# Patient Record
Sex: Female | Born: 1935 | Race: White | Hispanic: No | Smoking: Current every day smoker
Health system: Southern US, Community
[De-identification: ages and names within clinical notes are randomized; demographics above are authoritative.]

## PROBLEM LIST (undated history)

## (undated) DIAGNOSIS — I10 Essential (primary) hypertension: Secondary | ICD-10-CM

## (undated) DIAGNOSIS — I739 Peripheral vascular disease, unspecified: Secondary | ICD-10-CM

## (undated) DIAGNOSIS — I779 Disorder of arteries and arterioles, unspecified: Secondary | ICD-10-CM

## (undated) DIAGNOSIS — I2510 Atherosclerotic heart disease of native coronary artery without angina pectoris: Secondary | ICD-10-CM

## (undated) DIAGNOSIS — R0602 Shortness of breath: Secondary | ICD-10-CM

## (undated) DIAGNOSIS — I679 Cerebrovascular disease, unspecified: Secondary | ICD-10-CM

## (undated) DIAGNOSIS — E785 Hyperlipidemia, unspecified: Secondary | ICD-10-CM

## (undated) HISTORY — PX: ENDARTERECTOMY: SHX5162

## (undated) HISTORY — DX: Atherosclerotic heart disease of native coronary artery without angina pectoris: I25.10

## (undated) HISTORY — DX: Peripheral vascular disease, unspecified: I73.9

## (undated) HISTORY — DX: Cerebrovascular disease, unspecified: I67.9

## (undated) HISTORY — PX: OTHER SURGICAL HISTORY: SHX169

## (undated) HISTORY — DX: Hyperlipidemia, unspecified: E78.5

## (undated) HISTORY — DX: Essential (primary) hypertension: I10

## (undated) HISTORY — PX: PSEUDOANEURYSM REPAIR: SHX2272

## (undated) HISTORY — DX: Disorder of arteries and arterioles, unspecified: I77.9

---

## 2000-03-10 ENCOUNTER — Emergency Department (HOSPITAL_COMMUNITY): Admission: EM | Admit: 2000-03-10 | Discharge: 2000-03-11 | Payer: Self-pay | Admitting: Emergency Medicine

## 2002-03-31 ENCOUNTER — Encounter: Admission: RE | Admit: 2002-03-31 | Discharge: 2002-03-31 | Payer: Self-pay | Admitting: Internal Medicine

## 2002-03-31 ENCOUNTER — Encounter: Payer: Self-pay | Admitting: Internal Medicine

## 2002-09-01 ENCOUNTER — Encounter: Payer: Self-pay | Admitting: Vascular Surgery

## 2002-09-02 ENCOUNTER — Ambulatory Visit (HOSPITAL_COMMUNITY): Admission: RE | Admit: 2002-09-02 | Discharge: 2002-09-02 | Payer: Self-pay | Admitting: Vascular Surgery

## 2002-10-06 ENCOUNTER — Inpatient Hospital Stay (HOSPITAL_COMMUNITY): Admission: RE | Admit: 2002-10-06 | Discharge: 2002-10-08 | Payer: Self-pay | Admitting: Vascular Surgery

## 2002-10-06 ENCOUNTER — Encounter: Payer: Self-pay | Admitting: Vascular Surgery

## 2002-10-06 ENCOUNTER — Encounter (INDEPENDENT_AMBULATORY_CARE_PROVIDER_SITE_OTHER): Payer: Self-pay | Admitting: Specialist

## 2003-07-19 ENCOUNTER — Emergency Department (HOSPITAL_COMMUNITY): Admission: EM | Admit: 2003-07-19 | Discharge: 2003-07-19 | Payer: Self-pay | Admitting: Emergency Medicine

## 2003-07-27 ENCOUNTER — Ambulatory Visit (HOSPITAL_COMMUNITY): Admission: RE | Admit: 2003-07-27 | Discharge: 2003-07-27 | Payer: Self-pay | Admitting: Pediatrics

## 2006-08-05 ENCOUNTER — Ambulatory Visit: Payer: Self-pay | Admitting: Vascular Surgery

## 2006-08-21 ENCOUNTER — Ambulatory Visit: Payer: Self-pay | Admitting: Cardiology

## 2006-08-21 ENCOUNTER — Encounter: Payer: Self-pay | Admitting: Cardiology

## 2006-08-21 ENCOUNTER — Ambulatory Visit: Payer: Self-pay | Admitting: Vascular Surgery

## 2006-08-22 ENCOUNTER — Inpatient Hospital Stay (HOSPITAL_COMMUNITY): Admission: AD | Admit: 2006-08-22 | Discharge: 2006-08-25 | Payer: Self-pay | Admitting: Vascular Surgery

## 2006-09-02 ENCOUNTER — Ambulatory Visit: Payer: Self-pay | Admitting: Vascular Surgery

## 2006-09-09 ENCOUNTER — Ambulatory Visit: Payer: Self-pay | Admitting: Vascular Surgery

## 2006-09-15 ENCOUNTER — Ambulatory Visit: Payer: Self-pay | Admitting: Cardiology

## 2006-09-26 ENCOUNTER — Ambulatory Visit: Payer: Self-pay

## 2006-10-07 ENCOUNTER — Ambulatory Visit: Payer: Self-pay | Admitting: Vascular Surgery

## 2007-02-25 ENCOUNTER — Ambulatory Visit: Payer: Self-pay | Admitting: Cardiology

## 2007-03-03 ENCOUNTER — Ambulatory Visit: Payer: Self-pay

## 2007-09-16 ENCOUNTER — Ambulatory Visit: Payer: Self-pay | Admitting: Cardiology

## 2007-09-29 ENCOUNTER — Ambulatory Visit: Payer: Self-pay | Admitting: Cardiology

## 2007-10-12 ENCOUNTER — Ambulatory Visit: Payer: Self-pay | Admitting: Vascular Surgery

## 2008-03-11 ENCOUNTER — Ambulatory Visit: Payer: Self-pay

## 2008-03-23 ENCOUNTER — Ambulatory Visit: Payer: Self-pay | Admitting: Vascular Surgery

## 2008-03-30 ENCOUNTER — Ambulatory Visit: Payer: Self-pay | Admitting: Cardiology

## 2008-07-15 ENCOUNTER — Encounter (INDEPENDENT_AMBULATORY_CARE_PROVIDER_SITE_OTHER): Payer: Self-pay | Admitting: *Deleted

## 2008-08-19 ENCOUNTER — Encounter (INDEPENDENT_AMBULATORY_CARE_PROVIDER_SITE_OTHER): Payer: Self-pay | Admitting: *Deleted

## 2009-03-22 ENCOUNTER — Ambulatory Visit: Payer: Self-pay

## 2009-03-22 ENCOUNTER — Encounter: Payer: Self-pay | Admitting: Cardiology

## 2009-03-22 DIAGNOSIS — I6529 Occlusion and stenosis of unspecified carotid artery: Secondary | ICD-10-CM

## 2009-04-04 ENCOUNTER — Ambulatory Visit: Payer: Self-pay | Admitting: Vascular Surgery

## 2009-04-06 DIAGNOSIS — I739 Peripheral vascular disease, unspecified: Secondary | ICD-10-CM

## 2009-04-06 DIAGNOSIS — I679 Cerebrovascular disease, unspecified: Secondary | ICD-10-CM | POA: Insufficient documentation

## 2009-04-06 DIAGNOSIS — E785 Hyperlipidemia, unspecified: Secondary | ICD-10-CM

## 2009-04-06 DIAGNOSIS — I2510 Atherosclerotic heart disease of native coronary artery without angina pectoris: Secondary | ICD-10-CM | POA: Insufficient documentation

## 2009-04-06 DIAGNOSIS — I10 Essential (primary) hypertension: Secondary | ICD-10-CM | POA: Insufficient documentation

## 2009-04-13 ENCOUNTER — Ambulatory Visit: Payer: Self-pay | Admitting: Cardiology

## 2009-04-13 DIAGNOSIS — F17200 Nicotine dependence, unspecified, uncomplicated: Secondary | ICD-10-CM

## 2010-02-20 NOTE — Assessment & Plan Note (Signed)
Summary: f1y/ gd      Allergies Added: NKDA  Visit Type:  1 yr f/u Primary Provider:  Dr. Felipa Eth  CC:  edema/ankles...denies any cp or sob.  History of Present Illness: Ms Bluett returns today for evaluation and management of her coronary artery disease and peripheral vascular disease.  She's having no angina or ischemic symptoms. She does have some dyspnea on exertion. She continues to smoke about a half pack cigarettes a day.  She denies any symptoms of TIAs or mini strokes which were reviewed today. Her carotid Dopplers March 22, 2009 showed antegrade flow in both vertebrals and a nonobstructive plaque in the right internal carotid artery and 40-59% in the left internal carotid artery.  Her blood work is followed by primary care.  Her peripheral vascular disease of her lower extremities followed by Dr. Hart Rochester. She had checkup last week and said things are stable.  Current Medications (verified): 1)  Simvastatin 20 Mg Tabs (Simvastatin) .Marland Kitchen.. 1 Tab Once Daily 2)  Amlodipine Besylate 5 Mg Tabs (Amlodipine Besylate) .Marland Kitchen.. 1 Tab Once Daily 3)  Multivitamins   Tabs (Multiple Vitamin) .Marland Kitchen.. 1 Tab Once Daily 4)  Aspirin Ec 325 Mg Tbec (Aspirin) .... Take One Tablet By Mouth Daily 5)  Zolpidem Tartrate 10 Mg Tabs (Zolpidem Tartrate) .Marland Kitchen.. 1 Tab At Bedtime 6)  Metoprolol Succinate 25 Mg Xr24h-Tab (Metoprolol Succinate) .Marland Kitchen.. 1 Tab Once Daily  Allergies (verified): No Known Drug Allergies  Past History:  Past Medical History: Last updated: 04/06/2009 CAD, NATIVE VESSEL (ICD-414.01) HYPERLIPIDEMIA (ICD-272.4) HYPERTENSION (ICD-401.9) PERIPHERAL VASCULAR DISEASE (ICD-443.9) CEREBROVASCULAR DISEASE (ICD-437.9) CAROTID ARTERY DISEASE (ICD-433.10)    Past Surgical History: Last updated: 04/06/2009 Repair of right femoral pseudoaneurysm.  1. Attempted cannulation left common femoral artery with inability to       pass guidewire proximally through iliac system.   2. Abdominal aortogram  with bilateral lower extremity runoff via right       common femoral approach.   Left common femoral and proximal superficial femoral  endarterectomy with Dacron patch angioplasty.    Family History: Last updated: 04/06/2009  Negative for CAD in both mother and father.  She has a   brother with cancer.   Social History: Last updated: 04/06/2009  She lives in [place] alone.  She is retired.  She is   a widow.  Her daughter-in-law is a good historian and does keep her  records with her.  She is a heavy smoker, one and a half pack a day for  approximately 60+ years.  Negative for ETOH or drug use  Review of Systems       negative other than history of present illness  Vital Signs:  Patient profile:   77 year old female Height:      67 inches Weight:      158 pounds BMI:     24.84 Pulse rate:   75 / minute Pulse rhythm:   irregular BP sitting:   110 / 60  (left arm) Cuff size:   large  Vitals Entered By: Danielle Rankin, CMA (April 13, 2009 2:15 PM)  Physical Exam  General:  Well developed, well nourished, in no acute distress. Head:  normocephalic and atraumatic Eyes:  arcus senilis Neck:  Neck supple, no JVD. No masses, thyromegaly or abnormal cervical nodes. Lungs:  decreased breath sounds throughout Heart:  soft S1-S2 regular rate and rhythm Msk:  decreased ROM.  decreased ROM.   Pulses:  2+ over 4+ right dorsalis pedis 1+ over  4+ left Extremities:  No clubbing or cyanosis. Neurologic:  Alert and oriented x 3. Skin:  Intact without lesions or rashes.   Impression & Recommendations:  Problem # 1:  CAD, NATIVE VESSEL (ICD-414.01)  Her updated medication list for this problem includes:    Amlodipine Besylate 5 Mg Tabs (Amlodipine besylate) .Marland Kitchen... 1 tab once daily    Aspirin Ec 325 Mg Tbec (Aspirin) .Marland Kitchen... Take one tablet by mouth daily    Metoprolol Succinate 25 Mg Xr24h-tab (Metoprolol succinate) .Marland Kitchen... 1 tab once daily  Orders: EKG w/ Interpretation  (93000)  Problem # 2:  CAROTID ARTERY DISEASE (ICD-433.10) Assessment: Unchanged will check carotids and a year Her updated medication list for this problem includes:    Aspirin Ec 325 Mg Tbec (Aspirin) .Marland Kitchen... Take one tablet by mouth daily  Problem # 3:  HYPERTENSION (ICD-401.9) Assessment: Improved  Her updated medication list for this problem includes:    Amlodipine Besylate 5 Mg Tabs (Amlodipine besylate) .Marland Kitchen... 1 tab once daily    Aspirin Ec 325 Mg Tbec (Aspirin) .Marland Kitchen... Take one tablet by mouth daily    Metoprolol Succinate 25 Mg Xr24h-tab (Metoprolol succinate) .Marland Kitchen... 1 tab once daily  Problem # 4:  HYPERLIPIDEMIA (ICD-272.4)  Her updated medication list for this problem includes:    Simvastatin 20 Mg Tabs (Simvastatin) .Marland Kitchen... 1 tab once daily  Problem # 5:  TOBACCO USER (ICD-305.1) Assessment: Unchanged advised once again to quit  Patient Instructions: 1)  Your physician recommends that you schedule a follow-up appointment in: YEAR WITH DR Min Collymore 2)  Your physician recommends that you continue on your current medications as directed. Please refer to the Current Medication list given to you today.

## 2010-02-20 NOTE — Miscellaneous (Signed)
Summary: Orders Update  Clinical Lists Changes  Problems: Added new problem of CAROTID ARTERY DISEASE (ICD-433.10) Orders: Added new Test order of Carotid Duplex (Carotid Duplex) - Signed 

## 2010-04-12 ENCOUNTER — Other Ambulatory Visit: Payer: Self-pay | Admitting: *Deleted

## 2010-04-12 DIAGNOSIS — I2510 Atherosclerotic heart disease of native coronary artery without angina pectoris: Secondary | ICD-10-CM

## 2010-04-12 MED ORDER — METOPROLOL SUCCINATE ER 25 MG PO TB24: 25.0000 mg | ORAL_TABLET | Freq: Every day | ORAL | Status: DC

## 2010-05-10 ENCOUNTER — Encounter: Payer: Self-pay | Admitting: Cardiology

## 2010-05-11 ENCOUNTER — Encounter: Payer: Self-pay | Admitting: Cardiology

## 2010-05-11 ENCOUNTER — Ambulatory Visit (INDEPENDENT_AMBULATORY_CARE_PROVIDER_SITE_OTHER): Payer: Medicare Other | Admitting: Cardiology

## 2010-05-11 DIAGNOSIS — I6529 Occlusion and stenosis of unspecified carotid artery: Secondary | ICD-10-CM

## 2010-05-11 DIAGNOSIS — I2510 Atherosclerotic heart disease of native coronary artery without angina pectoris: Secondary | ICD-10-CM

## 2010-05-11 DIAGNOSIS — F17200 Nicotine dependence, unspecified, uncomplicated: Secondary | ICD-10-CM

## 2010-05-11 DIAGNOSIS — I739 Peripheral vascular disease, unspecified: Secondary | ICD-10-CM

## 2010-05-11 NOTE — Assessment & Plan Note (Signed)
Pt advised to stop.

## 2010-05-11 NOTE — Assessment & Plan Note (Signed)
Stable and asx.  Follow up dopplers 3/13.

## 2010-05-11 NOTE — Patient Instructions (Signed)
Your physician recommends that you schedule a follow-up appointment in: March 2013 with Dr. Daleen Squibb

## 2010-05-11 NOTE — Assessment & Plan Note (Signed)
Stable Stop smoking 

## 2010-05-11 NOTE — Progress Notes (Signed)
   Patient ID: Holly Taylor, female    DOB: 04/29/1928, 77 y.o.   MRN: [id number]  HPI Holly Taylor returns today for Holly Taylor of her vascular disease......coronary, carotid, and peripheral.  She has baseline DOE but denies angina or claudication. She has no sxs of TIA's. She is compliant with her meds but still smokes a pack a day.   Her EKG shows NSR with a first degree AVB.    Review of Systems  Constitutional: Negative for appetite change and unexpected weight change.  Respiratory: Positive for shortness of breath. Negative for chest tightness and wheezing.   Cardiovascular: Negative for chest pain, palpitations and leg swelling.  Neurological: Negative for dizziness, syncope, speech difficulty, weakness and light-headedness.      Physical Exam  Nursing note and vitals reviewed. Constitutional: She is oriented to person, place, and time. She appears well-developed and well-nourished.  HENT:  Head: Atraumatic.  Eyes: EOM are normal. Pupils are equal, round, and reactive to light.  Neck: Neck supple. No JVD present. No tracheal deviation present. No thyromegaly present.       Right carotid bruit  Cardiovascular: Regular rhythm and normal heart sounds.        Dorsalis pedis +1 on the left. Post tibial 1+ bilaterally.  Abdominal: Soft. Bowel sounds are normal.       No obvious bruits.  Musculoskeletal: Normal range of motion. She exhibits no edema.  Neurological: She is alert and oriented to person, place, and time.  Skin: Skin is dry.  Psychiatric: She has a normal mood and affect.

## 2010-05-11 NOTE — Assessment & Plan Note (Signed)
Stable. Continue medical therapy. Blood work with Dr Felipa Eth. Stop Smoking.

## 2010-05-13 ENCOUNTER — Other Ambulatory Visit: Payer: Self-pay | Admitting: Cardiology

## 2010-06-05 NOTE — Op Note (Signed)
Holly Taylor, Holly Taylor                ACCOUNT NO.:  [id number]   MEDICAL RECORD NO.:  [id number]          PATIENT TYPE:  INP   LOCATION:  2852                         FACILITY:  [facility]   PHYSICIAN:  Quita Skye. Hart Rochester, M.D.  DATE OF BIRTH:  08/29/1928   DATE OF PROCEDURE:  08/21/2006  DATE OF DISCHARGE:                               OPERATIVE REPORT   PREOPERATIVE DIAGNOSIS:  Severe left leg claudication secondary to iliac  occlusive disease.   PROCEDURES:  1. Attempted cannulation left common femoral artery with inability to      pass guidewire proximally through iliac system.  2. Abdominal aortogram with bilateral lower extremity runoff via right      common femoral approach.   SURGEON:  Quita Skye. Hart Rochester, M.D.   ANESTHESIA:  Local Xylocaine and Versed 1 mg intravenously.   CONTRAST:  155 mL.   COMPLICATIONS:  None.   DESCRIPTION OF PROCEDURE:  The patient was taken to the [facility]  Peripheral Endovascular Lab, placed in supine position at which time  both groins were prepped with Betadine scrub and solution, draped in  routine sterile manner.  Initial attempt was made after infiltration  with Xylocaine to enter the left femoral artery which had a previous  Dacron patch angioplasty and had some diffuse scarring.  The common  femoral vein was entered initially and a sheath was passed into this  vessel and upon passing the guidewire proximally, it was noted that we  were in the venous system.  A 5-French sheath had been placed and was  left in place.  The attempt was then made to enter the left femoral  artery which was entered, although the scar tissue was quite tense.  I  could never get the guidewire to pass proximally because of iliac  disease, therefore this was removed.  Pressure applied for 15 minutes  and no hematoma occurred.  Right femoral system was then entered.  Guidewire passed into the suprarenal aorta, 5-French sheath and dilator  passed over the guide wire,  standard pigtail catheter positioned in  suprarenal aorta.  Flush abdominal aortogram was performed injecting 20  mL of contrast at 20 mL per second.  This revealed the aorta to be  widely patent with single renal arteries bilaterally.  The right iliac  system was widely patent with some mild a rate irregularity.  There had  been previous stents placed in the common iliac artery which had no  evidence of any in-stent stenosis and were widely patent.  On the left  side, there were a few lesions of moderate stenosis in the left common  iliac system beginning at the origin extending down toward the  hypogastric.  There was a severe stenosis in the external iliac artery  near the origin of the hypogastric with late filling of the distal  external iliac and common femoral artery.  Additional views were  obtained in the AP, RAO and LAO projections of the iliac arteries,  confirming these findings.  Catheter was withdrawn into the terminal  aorta and bilateral lower extremity runoff performed injecting 88 mL  of  contrast at 8 mL per second.  This revealed the right common femoral,  superficial femoral and profunda femoral arteries to be widely patent.  The right superficial femoral had some mild diffuse disease and there  was two-vessel runoff on the right via the peroneal and anterior tibial  arteries, posterior tibial occluded in the midcalf.  On the left side,  there was a focal tight stenosis at the origin of the superficial  femoral artery with the common and profunda femoris arteries being  widely patent.  Superficial femoral artery was otherwise fairly smooth  and uninvolved except for one focal lesion in the mid portion  approximating 90% in severity and 1 cm in length with the distal  popliteal being patent with two-vessel runoff being the peroneal and  anterior tibial arteries on the left as well.  Having tolerated the  procedure well, the sheaths were both removed, adequate  compression  applied.  No complications ensued.   FINDINGS:  1. Widely patent aorta and right iliac system with patent iliac      stents.  2. Diffuse severe disease left iliac system involving the common and      external iliac arteries.  3. Focal proximal superficial femoral stenosis on the left and a mid      thigh stenosis on the left with two-vessel runoff via anterior      tibial and peroneal arteries on the left.  4. Mild diffuse superficial femoral artery disease on the right with      two-vessel runoff via anterior tibial and peroneal arteries.      [name], M.D.  Electronically Signed     JDL/MEDQ  D:  08/21/2006  T:  08/21/2006  Job:  [id number]

## 2010-06-05 NOTE — Assessment & Plan Note (Signed)
OFFICE VISIT   Holly, Taylor  DOB:  1928/03/23                                       10/07/2006  GMWNU#:[id number]   Holly Taylor returns today for further examination of the right inguinal  area where she had developed some lymphatic fluid following her surgical  repair of a pseudoaneurysm by Dr. Darrick Penna which was performed in July.  The right inguinal area is quite flat today.  No evidence of any  infection or inflammation.  She has stable claudication symptoms.  States she is able to walk almost 2 blocks now without severe symptoms.  She does have a small abdominal aortic aneurysm.  Is to be followed.  Is  only about 3.6 cm in diameter.  I have recommended that we not do any  revascularization at this time unless her symptoms worsen.  She will  return in 1 year with a duplex scan for her aneurysm and followup with  ABIs of her legs.   [name], M.D.  Electronically Signed   JDL/MEDQ  D:  10/07/2006  T:  10/08/2006  Job:  [id number]

## 2010-06-05 NOTE — Discharge Summary (Signed)
NAMELARK, LANGENFELD                ACCOUNT NO.:  [id number]   MEDICAL RECORD NO.:  [id number]          PATIENT TYPE:  INP   LOCATION:  2010                         FACILITY:  [facility]   PHYSICIAN:  Quita Skye. Hart Rochester, M.D.  DATE OF BIRTH:  [birth year]   DATE OF ADMISSION:  08/21/2006  DATE OF DISCHARGE:  08/25/2006                               DISCHARGE SUMMARY   ADMISSION DIAGNOSIS:  Severe left leg claudication secondary to iliac  occlusive disease.   DISCHARGE AND SECONDARY DIAGNOSES:  1. Severe left claudication secondary to iliac occlusive disease.  2. Postprocedure junctional bradycardia, resolved.  3. Postoperative right femoral hematoma with pseudoaneurysm status      post evacuation of hematoma and repair of right superficial femoral      artery.  4. Hypertension.  5. Hypercholesterolemia.  6. History of left common iliac and proximal thromboendarterectomy      with Dacron patch angioplasty September 2004 with percutaneous      transluminal cardiac angioplasty and stenting of the right common      iliac artery.  7. History of anal fissure repair.  8. History of bilateral cataract extraction in 1998.  9. Acute blood loss anemia secondary to right groin hematoma, improved      after transfusion.  10.Moderate carotid artery disease of 40-60% bilaterally (preliminary      report from carotid duplex on August 21, 2006).   ALLERGIES:  OXYCODONE which causes severe itching.   CONSULTATIONS:  Dr. Daleen Squibb, [facility].   PROCEDURES:  1. August 21, 2006, attempted cannulation of left common femoral artery      with inability to pass guidewire proximally through the iliac      system, abdominal aortogram with bilateral lower extremity runoff      via right common femoral approach by Dr. Josephina Gip.  2. August 21, 2006, repair of right femoral pseudoaneurysm, evacuation      of right groin hematoma by Dr. Dr. Fabienne Bruns with repair of      right superficial femoral artery.   HISTORY:  Ms. Plamondon is a 77 year old Caucasian female who was scheduled  for an outpatient arteriogram by Dr. Hart Rochester on August 21, 2006.  This was  done for history of claudication.  While in the recovery room from the  procedure, she was found to be in junctional bradycardia with  ventricular rate around 30 beats per minute.  She was asymptomatic.  She  denied chest pain, shortness of breath, dizziness, nausea, vomiting,  diaphoresis.  She had no prior history of cardiovascular disease.  Due  to the new junctional bradycardia, a cardiology consult was requested,  and she was evaluated by Dr. Daleen Squibb who felt she should be admitted for  cardiac monitoring and cardiac enzymes.  The Cardizem and atenolol were  placed on hold.   HOSPITAL COURSE:  Ms. Tarver was admitted to [facility]  following her arteriogram by Dr. Hart Rochester; however, she initially was  admitted under the cardiology service as she had some junctional  bradycardia post procedure.  She was admitted for telemetry monitoring,  2-D echocardiogram  and cardiac enzymes.  Her echocardiogram done on July  31 showed that it was a limited study due to poor acoustic windows.  Overall left ventricular systolic function was at the lower limits of  normal with EF around 50-55%. Left ventricular wall thickness was mildly  increased.  The study was inadequate for evaluation of left ventricular  regional wall motion.  The aortic valve thickness was mildly increased.  The inferior vena cava was dilated.  There was minimal pericardial  effusion versus epicardial adipose tissue.  Her cardiac enzymes were  normal.  Most recently on August 1, creatinine kinase was 80. CK-MB was  2.5, and troponin was 0.02.  Her TSH was normal at 2.030, and lipid  panel showed cholesterol 94, triglycerides 43, HDL 37, LDL 48, VLDL 9,  total cholesterol-HDL ratio of 2.5.   Bilateral carotid artery duplex was done as she had carotid bruits noted  on physical  exam, and preliminary report showed 40-60% internal carotid  artery stenosis bilaterally.   In regards to her cardiac issues, her junctional bradycardia resolved  within 24 hours.  Her atenolol and Cardizem were not resumed; however,  Norvasc was added for hypertension.  At discharge, Dr. Daleen Squibb discussed  arranging followup with him and scheduling an outpatient Myoview study  in the future.   From a vascular surgery standpoint, she unfortunately developed of right  groin hematoma in the late evening following her arteriogram.  She was  seen by on-call surgeon, Dr. Fabienne Bruns.  She did have a CT scan of  the abdomen and pelvis which showed large right groin hematoma with no  evidence of retroperitoneal hemorrhage.  Her hemoglobin that evening was  9.7, hematocrit of 29 which was down from before her procedures at 13  and 38.4, respectively.  With her new cardiac symptoms, it is felt it  would be best to go ahead and transfuse her with 2 units of packed red  blood cells.  Her followup hemoglobin and August 1 were 10.9 and 31.8,  respectively.   Jackson-Pratt drain was placed intraoperatively and was discontinued on  August 3 as output was now minimal.  Her feet remained adequately  perfused. Her pain was controlled initially with Darvocet; however, the  patient said that she had taken this for her junctional bradycardia  episode and was afraid to take it and was later transitioned to Ultram.  She was able to void following Foley catheter removal and was walking  independently.  Her left groin remained soft without evidence of  hematoma. Her right groin still showed a fair amount of ecchymosis with  some firmness laterally but overall improved and no signs of infection.  Incision was closed with staples, and there was no significant drainage  following removal of her Jackson-Pratt drain.   On August 25, 2006, Ms. Walraven was felt appropriate for discharge.  Her  blood pressure had been  as high as 182/77; however, this was down to  134/73 with the addition of Norvasc. Her heart rate was primarily in the  80s to 90s in sinus rhythm.  She was afebrile, saturating 97% on room  air.  Other labs not previously mentioned showed a sodium of 133,  potassium 4.2, chloride 101, CO2 26, blood glucose 141, BUN 7,  creatinine 0.72, calcium 8.6. White blood count of 12.5 and platelet  count 152.   Since Ms. Sobol's junctional bradycardia has resolved and her right  groin pseudoaneurysm had been repaired, and she was hemodynamically  stable, she was felt appropriate for discharge home on August 25, 2006,  in stable condition.   DISCHARGE MEDICATIONS:  1. Simvastatin 20 mg daily.  2. Multivitamins with calcium and iron daily.  3. Aspirin 325 mg daily.  4. Norvasc 5 mg daily.  5. Nicotine patch 21 mg daily.  6. Ultram 50 mg 1 tablet p.o. q.4 h p.r.n. pain.   DISCHARGE INSTRUCTIONS:  She is to follow a heart-healthy diet.  Increase her activity slowly.  She may shower and clean incisions gently  with soap and water.  She should call if she develops redness or  purulent drainage from her incision site or increased pain or swelling  in her groin region.  She should avoid driving or heavy lifting for the  next 1-2 weeks, increase activity as tolerated.   FOLLOWUP:  1. She is to follow up with Dr. Hart Rochester in approximately 2 weeks with      staple removal at that time as well.  Our office will contact her      regarding specific appointment date and time. Will discuss      scheduling bypass surgery in the future, reportedly femoral-femoral      bypass grafting.  2. Dr. Daleen Squibb will arrange followup for a Myoview study at his office.      Jerold Coombe, P.A.      [name], M.D.  Electronically Signed    AWZ/MEDQ  D:  08/25/2006  T:  08/25/2006  Job:  [id number]   cc:   Larina Earthly, M.D.  Thomas C. Wall, MD, [facility]

## 2010-06-05 NOTE — H&P (Signed)
Holly Taylor, Holly Taylor                ACCOUNT NO.:  [id number]   MEDICAL RECORD NO.:  [id number]          PATIENT TYPE:  INP   LOCATION:  2852                         FACILITY:  [facility]   PHYSICIAN:  Jesse Sans. Wall, MD, FACCDATE OF BIRTH:  1928-05-25   DATE OF ADMISSION:  08/21/2006  DATE OF DISCHARGE:                              HISTORY & PHYSICAL   PRIMARY CARE PHYSICIAN:  Dr. Felipa Eth.   VASCULAR PHYSICIAN:  Dr. Hart Rochester.   CARDIOLOGIST:  New, Dr. Juanito Doom.   REQUESTING PHYSICIAN:  Dr. Hart Rochester.   REASON FOR CONSULTATION:  Junctional bradycardia.   HISTORY OF PRESENT ILLNESS:  This is a 77 year old, very pleasant,  Caucasian female, who was here for an outpatient bilateral lower  extremity angiogram secondary to a known history of peripheral vascular  disease.  The patient has no prior history of cardiovascular disease,  but does have a history of hypertension and hypercholesterolemia.  The  patient in recovery was found to be in junctional bradycardia with a  ventricular rate of 30 to 31 beats per minute.  She is asymptomatic.  She denies any chest pain, shortness of breath and dizziness, nausea,  vomiting or diaphoresis.  The patient denies any prior history of same  symptoms.  The only symptoms her daughter-in-law, who is at bedside, has  noted is that she has been slowing down a lot lately over the last  several months, and has had continued complaints of lower extremity  pain.   The patient has been followed by Dr. Hart Rochester, and did have a left common  iliac and proximal thromboendarterectomy with a Dacron patch angioplasty  in September 2004, and a PTCA and stenting of the right common iliac  artery subsequent to that.  The patient had continued to have some lower  extremity pain.  ABIs were completed, which elucidated repeat of the  angiography.  It was found that she did have some left lower extremity  stenosis, and was to have planned intervention per Dr. Hart Rochester, although  this  was not scheduled at the time of this consultation.  Secondary to  the junctional bradycardia, we are asked to evaluate further.   REVIEW OF SYSTEMS:  As described; otherwise negative.   PAST MEDICAL HISTORY:  1. Hypertension.  2. Peripheral vascular disease.  3. Hypercholesterolemia.  4. Status post left common iliac and proximal thromboendarterectomy      with Dacron patch angioplasty in September 2004, and PTCA and      stenting of the right common iliac artery.   PAST SURGICAL HISTORY:  As above.   SOCIAL HISTORY:  She lives in [place] alone.  She is retired.  She is  a widow.  Her daughter-in-law is a good historian and does keep her  records with her.  She is a heavy smoker, one and a half pack a day for  approximately 60+ years.  Negative for ETOH or drug use.   FAMILY HISTORY:  Negative for CAD in both mother and father.  She has a  brother with cancer.   CURRENT MEDICATIONS:  (Which she did take today)  1. Cardizem-CD 60 mg daily.  2. Atenolol 50 mg one half tablet daily.  3. Multivitamin with calcium once a day.  4. Aspirin 325 once a day.  5. Simvastatin 20 mg at h.s.   ALLERGIES:  OXYCODONE, causing severe itching.   CURRENT LABORATORY DATA:  Hemoglobin 13.0, hematocrit 38.4, white blood  cells 8.1, platelets 242.  Sodium 133, potassium 4.1, chloride 99, CO2  of 27, BUN 8, creatinine 0.84, glucose 94.  PTT 28, PT of 13.1, INR 1.0.  Chest x-ray revealing cardiac enlargement.   PHYSICAL EXAMINATION:  VITAL SIGNS:  Blood pressure 120/47, pulse 30,  respirations 20.  HEENT:  Head is normocephalic, atraumatic.  Eyes:  PERRLA.  Mucous  membranes in mouth are pink and moist.  Tongue is midline.  NECK:  Supple.  There is no thyromegaly.  There is no JVD.  There are  positive carotid bruits, greater on the right than on the left.  CARDIOVASCULAR:  Regular rate and rhythm, bradycardic, without murmurs,  rubs or gallops at present.  Pulses are equal and bilateral in  the  radial pulses, but diminished in the lower extremities.  LUNGS:  Clear to auscultation essentially, but she does have some  bibasilar crackles.  ABDOMEN:  Soft, nontender.  No abdominal bruits are appreciated.  EXTREMITIES:  There is no clubbing or cyanosis or edema, but she does  have diminished pulses, which are able to be auscultated with Doppler.  NEURO:  Cranial nerves II-XII are grossly intact.   IMPRESSION:  1. Junctional bradycardia.  2. Peripheral vascular disease, status post lower extremity      angiography.  3. Hypertension.  4. Hyperlipidemia.  5. Tobacco abuse.   PLAN:  This is a 77 year old Caucasian female without any prior cardiac  history, found to be in junctional bradycardia today after angiography.  The patient is asymptomatic prior today, and has had recent vascular  evaluation with known vascular disease.  The patient probably has  coronary artery disease as well.  She is on Cardizem and atenolol, and  this was taken today.   Our plan will be to admit the patient to step-down, monitor cardiac  enzymes, hold Cardizem and atenolol.  Get echocardiogram, cycle cardiac  enzymes, do bilateral carotid Dopplers secondary to bruits.  The patient  may need  cardiac catheterization once contrast clears.  We will give her IV  hydration in the interim.  Dr. Hart Rochester will be notified of the patient's  admission, and we will follow, making further recommendations based upon  the hospital course and response to treatment.      Bettey Mare. Lyman Bishop, NP      Jesse Sans. Daleen Squibb, MD, [facility]  Electronically Signed    KML/MEDQ  D:  08/21/2006  T:  08/21/2006  Job:  [id number]   cc:   Larina Earthly, M.D.

## 2010-06-05 NOTE — Assessment & Plan Note (Signed)
OFFICE VISIT   Holly Taylor, Holly Taylor  DOB:  07-05-1928                                       09/09/2006  EAVWU#:[id number]   The patient returns today for further evaluation of the right groin.  Thirty cc of dark, fairly thin fluid was aspirated. It is not foul  smelling or purulent in appearance. It decompressed the area lateral to  the incision nicely and the incision continues to heal well. She will  return in four weeks for further examination.   [name], M.D.  Electronically Signed   JDL/MEDQ  D:  09/09/2006  T:  09/10/2006  Job:  [id number]

## 2010-06-05 NOTE — Assessment & Plan Note (Signed)
[facility]                            CARDIOLOGY OFFICE NOTE   ARLENIS, BLAYDES                       MRN:          [id number]  DATE:09/15/2006                            DOB:          Mar 26, 1928    Ms. Territo returns today for management of the following issues.  She  was discharged in the hospital on August 25, 2006.  We saw her in  consultation at that time for junctional bradycardia after a peripheral  vascular procedure.   For problem list, please see the discharge summary for August 21, 2006.   Her junctional bradycardiac resolved after discontinuation of her  atenolol and Cardizem which we recommended.  We placed her on amlodipine  which has controlled her blood pressure beautifully.  Her daughter  brings in readings today which are quite good.  She has a history of  peripheral vascular disease with a left common iliac proximal__________  endarterectomy with Dacron patch angioplasty in September 2004, with  PTCA and stenting of the right common iliac artery.  She is currently  being managed.  She did have a pseudoaneurysm of the right femoral  region and hematoma that required evacuation and repair of the right  superficial femoral artery.  She has had a couple of percutaneous  drainages in the office according to her daughter with Dr. Hart Rochester over  the last few weeks.   She also had moderate carotid artery disease 40-60% bilaterally.  Her  coronaries need to be evaluated.   Her risk factors were tobacco use, which she has quit!  She also has a  history of hyperlipidemia and was started on simvastatin recently with  Dr. Felipa Eth.  She has hypertension.   CURRENT MEDICATIONS:  1. Simvastatin 20 mg a day.  2. Amlodipine 5 mg a day.  3. Multivitamin.  4. Aspirin 325 a day.  5. Tylenol PM nightly.   PHYSICAL EXAMINATION:  VITAL SIGNS:  Her blood pressures have ranged  from 129 to about 153.  Her heart rates have been in the 80s and 90s.  Her heart rate today is 83 with a first degree AV block at 238 msec.  There is no ST segment changes.  Her blood pressure in the office today  is 140/74, pulse 83 and regular, her weight is 159.  HEENT:  Normocephalic and atraumatic.  PERRLA.  Extraocular movements  intact.  Sclerae are slightly injected.  Facial symmetry is normal.  NECK:  Carotids are full with a right carotid bruit, greater than the  left.  Thyroid is not enlarged.  Trachea is midline.  LUNGS:  Decreased breath sounds throughout.  CARDIOVASCULAR:  Nondisplaced PMI.  She has normal S1 and S2.  ABDOMEN:  Soft with good bowel sounds.  No midline bruit.  There is no  hepatomegaly.  EXTREMITIES:  Absent pulses bilaterally in the posterior tibial region  but faint dorsalis pedis.  She has some mild peripheral edema.  She has  some varicose veins.  No sign of DVT.  NEUROLOGIC:  Intact.   A 2-D echocardiogram  in the hospital showed normal left  ventricular  function, poor windows, mild LVH, no regional wall motion abnormalities,  mild increase in aortic valve thickness.   Her total cholesterol was 94, triglycerides 43, HDL 37, LDL 48.   ASSESSMENT/PLAN:  Ms. Hursey is stable.  She has diffuse vascular  disease and most likely has some coronary disease.  I am delighted she  has quite smoking.  Her lipids are under very good control.  Her blood  pressure is under much better control.   PLAN:  Adenosine rest stress Myoview.  If this does not show any  obsessive coronary disease.  Will continue with risk factor  modification.  I will see her back in six months.  She has a follow-up  soon with Dr. Felipa Eth.  She will continue to follow with Dr. Hart Rochester  concerning her right groin issues.     Thomas C. Daleen Squibb, MD, [facility]  Electronically Signed    TCW/MedQ  DD: 09/15/2006  DT: 09/16/2006  Job #: [id number]   cc:   Larina Earthly, M.D.  [name], M.D.

## 2010-06-05 NOTE — Assessment & Plan Note (Signed)
[facility]                            CARDIOLOGY OFFICE NOTE   Holly, Taylor                       MRN:          [id number]  DATE:03/30/2008                            DOB:          1928/05/17    Holly Taylor comes in today for followup.  She is having no symptoms of  angina or ischemia.   She has had no presyncope or syncope.  She has no significant  claudication except in her left leg.  Please see my problem list from  September 16, 2007.   She is continuing to smoke.  We have talked about this ad nauseam in the  past.   CURRENT MEDICATIONS:  1. Simvastatin 20 mg a day.  2. Amlodipine 5 mg a day.  3. Multivitamin with iron daily.  4. Aspirin 325 mg a day.  5. Zolpidem 10 mg at bedtime.  6. Metoprolol succinate 25 mg a day.   PHYSICAL EXAMINATION:  VITAL SIGNS:  Blood pressure today is 156/60,  pulse 72 and regular; weight is 163, down 5; respiratory rate is 18 and  unlabored.  SKIN:  Warm and dry.  HEENT:  Arcus senilis, multiple wrinkles, otherwise negative.  NECK:  Supple.  Carotid upstrokes were equal bilaterally with loud right  carotid bruit.  Thyroid is not enlarged.  Trachea is midline.  LUNGS:  Decreased breath sounds throughout.  Expiratory rhonchi.  HEART:  Nondisplaced PMI.  Soft S1 and S2.  No gallop, rub, or murmur.  ABDOMEN:  Soft, no midline or pulsatile mass.  No hepatomegaly.  EXTREMITIES:  No cyanosis, clubbing, or edema.  Pulses are 2+ on the  right, trace to 1+ on the left.  There is no sign of DVT.  NEURO:  Intact.   ASSESSMENT AND PLAN:  Holly Taylor is stable from our standpoint.  Continue to encourage her to take her meds and not smoke as in the past.   I do have a recent ultrasound of the abdominal aorta done by Dr. Hart Rochester.  It shows no evidence of an abdominal aortic aneurysm.  Study dated  October 12, 2007.     Thomas C. Daleen Squibb, MD, [facility]  Electronically Signed    TCW/MedQ  DD: 03/30/2008  DT:  03/31/2008  Job #: [id number]

## 2010-06-05 NOTE — Assessment & Plan Note (Signed)
OFFICE VISIT   ANETTE, BARRA  DOB:  [birth year]                                       09/02/2006  ZOXWR#:[id number]   Ms. Qian underwent angiography by me on July 31.  She had attempt at  entering her left common femoral artery which was successful, but I was  unable to pass the guidewire proximally, although her artery was  severely calcified.  We did perform the procedure through the right  common femoral approach, but unfortunately she developed a hematoma  postoperatively because of the severe calcification in her right femoral  artery, and this was repaired by Dr. Darrick Penna.  The patient went home 3  days ago and has had reaccumulation of fluid in the right lower  quadrant.  On examination, the right inguinal wound is healing  satisfactorily with some mild erythema, but there is an accumulation of  fluid beneath the subcutaneous tissue just above the inguinal ligament  on the right side.  I aspirated about 100 mL of thin, straw-colored  fluid which is not purulent or infected in appearance.  She has well-  perfused feet.  Blood pressure 148/81, heart rate 87, respirations 18.  She will return in 1 week for reexamination.   [name], M.D.  Electronically Signed   JDL/MEDQ  D:  09/02/2006  T:  09/04/2006  Job:  [id number]

## 2010-06-05 NOTE — Assessment & Plan Note (Signed)
[name]                            CARDIOLOGY OFFICE NOTE   Holly Taylor, Holly Taylor                       MRN:          [id number]  DATE:09/16/2007                            DOB:          February 19, 1928    [name] comes in today for further management of the following  issues:  1. Nonobstructive coronary artery disease.  She is having no angina.      She does have some dyspnea on exertion, which is probably      pulmonary.  Her last stress Myoview was September 26, 2006; EF of      69% with no ischemia.  2. Cerebrovascular disease with nonobstructive bilateral carotid      artery disease.  3. Peripheral vascular disease.  She has had intervention and she is      having followup with Dr. Hart Rochester on a regular basis.  She is having      very little claudication.  4. Hypertension.  5. Hyperlipidemia.  Her most recent numbers were goal with an HDL of      45 and a LDL of 51 with Dr. Felipa Eth.   She states her blood pressure is currently going up and down.  Unfortunately, she still smokes a couple of cigarettes a day.   When I first met her, she was in a junctional bradycardia, for which she  was on both atenolol and Diltiazem.  We switched her over to amlodipine  5 mg a day.  Her resting heart rate has been running in the 90s and  today it is about 105.   She denies any tachypalpitations, presyncope, or syncope.   Her other meds are;  1. Aspirin 325 a day.  2. Zolpidem 10 mg at bedtime.  3. Multivitamin.  4. Simvastatin 20 mg a day.   PHYSICAL EXAMINATION:  VITAL SIGNS:  Her blood pressure today is 140/58,  pulse is 104 and regular, weight is 169.  GENERAL:  She is in no acute distress.  HEENT:  Unchanged.  NECK:  Carotids are full with bilateral soft bruits.  Thyroid is not  enlarged.  Trachea is midline.  LUNGS:  Reveal decreased breath sounds throughout with some expiratory  rhonchi.  HEART:  Reveals a nondisplaced PMI.  Rapid rate and rhythm  consistent  with sinus tach.  ABDOMEN:  Soft.  There is no midline or pulsatile bruit.  EXTREMITIES:  Reveal no edema.  Her pulses were 2+/4+ in the right lower  extremity dorsalis pedis and posterior tibial, 1+/4+ in the left  dorsalis pedis.  No sign of DVT.  NEURO:  Intact.   I am concerned about [name]'s heart rate being over 100.  Her blood  pressures have been fairly labile as well.   I have added low-dose metoprolol succinate 25 mg a day.  We will bring  her back for an EKG.  Blood pressure check in 2 weeks.  I do not think  she will become bradycardic, but we will just be careful in observation.     Thomas C. Daleen Squibb, MD, [facility]  Electronically Signed  TCW/MedQ  DD: 09/16/2007  DT: 09/17/2007  Job #: [id number]   cc:   Larina Earthly, M.D.  [facility]

## 2010-06-05 NOTE — Op Note (Signed)
Holly Taylor, Holly Taylor                ACCOUNT NO.:  [id number]   MEDICAL RECORD NO.:  [id number]          PATIENT TYPE:  OBV   LOCATION:  3312                         FACILITY:  [facility]   PHYSICIAN:  Janetta Hora. Fields, MD  DATE OF BIRTH:  Jul 26, 1928   DATE OF PROCEDURE:  08/21/2006  DATE OF DISCHARGE:                               OPERATIVE REPORT   PROCEDURE:  Repair of right femoral pseudoaneurysm.   PREOPERATIVE DIAGNOSIS:  Right femoral pseudoaneurysm.   POSTOPERATIVE DIAGNOSIS:  Right femoral pseudoaneurysm.   ANESTHESIA:  General.   ASSISTANT:  Allen Kell, RNFA.   OPERATIVE FINDINGS:  1. Hole in right superficial femoral artery, with primary repair.  2. A #10 flat JP.   OPERATIVE DETAILS:  After obtaining informed consent, the patient taken  to the operating room.  The patient was placed in the supine position on  the operating table.  After induction of general anesthesia and  endotracheal intubation, the patient's entire right lower extremity was  prepped and draped in the usual sterile fashion.  The patient had a  brief episode of bradycardia, with heart rate down into the high 20s,  and she was briefly externally paced.  Her heart rate then recovered  into the 50s.  Her heart rate then remained in the 70s, and she was  normotensive for the remainder of the case.   After the patient's right lower extremity had been prepped and draped, a  longitudinal incision was made over the right common femoral artery.  Incision was carried down through subcutaneous tissues, and a large  hematoma was evacuated.  There was bright red blood from the base of the  incision.  Dissection was continued down to the level of the right  common femoral artery.  This was dissected free circumferentially, and  digital pressure was used to control the bleeding.  The common femoral  artery was heavily calcified.  Dissection was carried down to the level  of the femoral bifurcation.  The  profunda femoris was identified,  dissected free circumferentially, and controlled with a vessel loop.  A  hole was then found in the proximal superficial femoral artery.  This  was also heavily calcified.  This was dissected free circumferentially  just below the hole.  The hole was then repaired with three 5-0  interrupted Prolene sutures.  Hemostasis was then obtained.  The wound  was then thoroughly irrigated normal saline solution.  The groin was  then closed in layers with multiple running 2-0 and 3-0 Vicryl sutures.  A #10 flat JP was placed at the base of the wound and brought out  through a separate stab incision and secured to the skin with a 3-0  nylon suture.  The skin was closed with staples.  Other than a brief  episode of bradycardia at the beginning of the procedure, the patient  tolerated the procedure well, and there were no complications.  Instrument, sponge, and  needle count was correct at the end of the case.  The patient was taken  to the recovery room in stable condition.  The patient  had a good  Doppler signal in the common femoral, profunda femoris, and superficial  femoral, as well as dorsalis pedis artery at the end of the case.      Janetta Hora. Fields, MD  Electronically Signed     CEF/MEDQ  D:  08/21/2006  T:  08/22/2006  Job:  [id number]

## 2010-06-05 NOTE — Assessment & Plan Note (Signed)
[facility]                            CARDIOLOGY OFFICE NOTE   YERALDI, FIDLER                       MRN:          [id number]  DATE:02/25/2007                            DOB:          1928/05/06    Ms. Holly Taylor returns today for follow-up.   PROBLEM LIST:  1. Nonobstructive coronary disease.  She had negative stress Myoview      September 26, 2006 with normal systolic function, ejection fraction      69%.  2. Cerebrovascular disease with 46% bilateral carotid artery stenoses.  3. Peripheral vascular disease status post intervention as outlined in      my previous note.  Specifically she has a left common iliac      endarterectomy with a Dacron patch and also has had a percutaneous      transluminal coronary angioplasty  and stent of the right common      iliac.  She is having very little claudication.  4. Hypertension.  5. Hyperlipidemia.  This been followed by Dr. Felipa Eth.  Her labs look      remarkably good on September 15, 2006.   She has no complaints today.  She states she is only smoking a couple  cigarettes a week.  This is remarkable considering she was smoking well  over a pack before.   1. She is on simvastatin 20 mg a day.  2. Amlodipine 5 mg a day.  3. Multivitamin with calcium and iron daily.  4. Aspirin 325 mg a day.  5. Zolpidem 10 mg nightly.  6. Ranitidine 50 mg nightly.   Her blood pressure is 150/70.  She says it usually runs around 120/70 in  the mornings.  She is a little bit anxious.  Pulses 80 and regular.  Weight is 168.  She has gained about 9 pounds.  HEENT:  Normocephalic, atraumatic.  PERRLA.  Extraocular movements are  intact.  Sclerae are clear.  She has arcus senilis; facial symmetry is  normal.  Carotids were equal bilaterally with bilateral bruits.  Thyroid  is not enlarged.  Trachea is midline.  LUNGS:  Were remarkable for decreased breath sounds throughout.  Her PMI is nondisplaced. She has normal S1-S2.  She  has an S4.  ABDOMEN: Soft, good bowel sounds.  No midline bruits.  EXTREMITIES: There is no edema.  Pulses were 2+/ 4+ on the right lower  extremity; 1+/4+ dorsalis pedis on the left.  There is no sign of DVT.  NEURO:  Exam is intact.   EKG shows sinus rhythm with first-degree AV block.   Ms. Dowding is doing well.  I have reinforced no smoking which she done  remarkably well with.  She will stay with her current meds.  I will plan  on seeing her back again in 6 months.  In the meantime, I will schedule  some carotid Dopplers for follow-up.     Thomas C. Daleen Squibb, MD, [facility]  Electronically Signed    TCW/MedQ  DD: 02/25/2007  DT: 02/26/2007  Job #: [id number]   cc:   Larina Earthly, M.D.

## 2010-06-05 NOTE — Assessment & Plan Note (Signed)
OFFICE VISIT   YARNELL, KOZLOSKI  DOB:  [birth year]                                       08/05/2006  XBJYN#:[id number]   The patient underwent a left common femoral and proximal superficial  thromboendarterectomy with Dacron patch angioplasty by me in September  of 2004 and had an excellent early result with an ABI improving to 100%  on the left and 90% on the right following PTA and stenting of her right  common iliac artery.  About 1 year ago she developed recurrent  claudication in the left hip beginning in the buttock, extending into  the thigh and calf which now limits her to walking less than 50 yards.  She has no rest pain but does occasionally have tingling in the left.  She has very minimal symptoms in the right leg.  She denies any chest  pain, dyspnea on exertion, or cardiac-type symptoms and has also had no  TIAs, amaurosis fugax, diplopia, blurred vision, or syncope or  neurologic-type symptoms.  She does take one aspirin per day.   She continues to smoke 1-1/2 packs of cigarettes per day and has done so  for 60+ years, she dyspnea does not use alcohol.   PAST MEDICAL HISTORY:  Negative for diabetes, coronary artery disease,  COPD, and stroke but it is positive for hypertension.   PHYSICAL EXAMINATION:  Blood pressure 188/85, heart rate 66,  respirations are 18.  The carotid pulse is a 3+ with no audible bruits.  Neurologic exam is normal.  Chest:  Clear to auscultation.  Her abdomen  is soft, nontender, with no masses.  She has 3+ femoral, popliteal, and  dorsalis pedis pulse on the right leg.  Left leg has absent femoral and  distal pulses.   I feel that she has occluded her left iliac system and will likely need  a right to left femoral-femoral bypass if feasible.  She may need  dilatation or further stenting of her right common iliac  system.  I have scheduled her for an angiogram on July 31 to determine  what options are available, we  will then proceed as indicated.   [name], M.D.  Electronically Signed   JDL/MEDQ  D:  08/05/2006  T:  08/06/2006  Job:  [id number]   cc:   Larina Earthly, M.D.

## 2010-06-05 NOTE — Assessment & Plan Note (Signed)
Holly Taylor                            CARDIOLOGY OFFICE NOTE   Holly, Taylor                       MRN:          [id number]  DATE:09/29/2007                            DOB:          1928-08-15    [name] comes in today for close followup.  I saw her in the office 2  weeks ago at which time I started metoprolol succinate 25 mg a day for  labile hypertension and a heart rate of 104.   She is asymptomatic.  Her blood pressure is 142/74.  Her pulse is 66 and  regular.   EKG confirms sinus rhythm with first-degree AV block of 238 msec.  There  is no significant change in fact her AV block was greater at last time.   I have made no further changes in her program.  I am glad her heart  rates down into the 60s.  I do not think she will become bradycardic.  We will see her back again in 6 months.     Thomas C. Daleen Squibb, MD, [facility]  Electronically Signed    TCW/MedQ  DD: 09/29/2007  DT: 09/30/2007  Job #: [id number]

## 2010-06-05 NOTE — Procedures (Signed)
DUPLEX ULTRASOUND OF ABDOMINAL AORTA   INDICATION:  Follow up abdominal aortic aneurysm exam.   HISTORY:  Diabetes:  No.  Cardiac:  No.  Hypertension:  Yes.  Smoking:  Current.  Connective Tissue Disorder:  Family History:  No.  Previous Surgery:  Right common iliac artery stent, left common femoral  and superficial femoral artery endarterectomies.   DUPLEX EXAM:         AP (cm)                   TRANSVERSE (cm)  Proximal             2.4 Cm                    2.0 cm  Mid                  1.8 cm                    2.2 cm  Distal               1.5 cm                    1.5 cm  Right Iliac          0.94 cm                   1.1 cm  Left Iliac           0.9 cm                    1.1 cm   PREVIOUS:  Date:  AP:  TRANSVERSE:   IMPRESSION:  No evidence of abdominal aortic aneurysm noted.   ___________________________________________  [name], M.D.   CH/MEDQ  D:  10/12/2007  T:  10/12/2007  Job:  [id number]

## 2010-06-08 NOTE — Op Note (Signed)
NAME:  Holly Taylor, PINEGAR                          ACCOUNT NO.:  [id number]   MEDICAL RECORD NO.:  [id number]                   PATIENT TYPE:  INP   LOCATION:  2873                                 FACILITY:  [facility]   PHYSICIAN:  Quita Skye. Hart Rochester, M.D.               DATE OF BIRTH:  18-Mar-1928   DATE OF PROCEDURE:  10/06/2002  DATE OF DISCHARGE:                                 OPERATIVE REPORT   PREOPERATIVE DIAGNOSIS:  Severe claudication left leg secondary to near  total occlusion left common femoral artery.   POSTOPERATIVE DIAGNOSIS:  Severe claudication left leg secondary to near  total occlusion left common femoral artery.   OPERATION:  Left common femoral and proximal superficial femoral  endarterectomy with Dacron patch angioplasty.   SURGEON:  Quita Skye. Hart Rochester, M.D.   FIRST ASSISTANT:  Nurse.   ANESTHESIA:  General endotracheal.   PROCEDURE:  The patient was taken to the operating room and placed in the  supine position.  At which time, satisfactory general endotracheal  anesthesia was administered.  The left inguinal region was prepped with  Betadine scrub and solution and draped in a routine sterile manner.  A  longitudinal incision was made over the femoral triangle on the left side  and carried down through subcutaneous tissue.  Branches of the saphenous  vein were ligated with 2-0 Silk ties and divided, exposing the common  superficial and profundus femoral arteries.  The external iliac artery was  exposed well up beneath the inguinal ligament.  There was total occlusion of  the common femoral artery with a heavily calcified plaque extending up  proximal to the inguinal ligament.  There was a good pulse about 3-4 cm  proximal to the inguinal ligament.  Superficial femoral artery had severe  disease, however, at its proximal 2-3 cm and it was dissected out distal to  this point and 6000 units of heparin was given intravenously.  The vessels  were occluded with the  vascular clamps.  A longitudinal opening was made in  the common femoral, extended with the Potts scissors, up into the external  iliac artery proximally, and extended down the superficial femoral artery  about 5 cm distal to the most severe disease.  The plaque was almost totally  occlusive in nature in the common femoral artery and heavily calcified.  A  full layered endarterectomy was performed beginning in the distal external  iliac artery extending down into the superficial femoral artery and the  plaque feathered off nicely.  A few tacking sutures were placed around the  orifice of the fundi which was widely patent.  A Dacron patch was sewn into  place with 6-0 Proline, the clamps were released, and there was an excellent  pulse in all vessels, and a palpable dorsalis pedis pulse in the foot.  Protamine was given to reverse the heparin following the adequate  hemostasis.  Wounds  were irrigated with saline and closed in layers of  Vicryl in a subcuticular fashion.  Sterile dressing was applied.  The  patient was taken to the recovery room in satisfactory condition.                                               [name], M.D.    JDL/MEDQ  D:  10/06/2002  T:  10/06/2002  Job:  [id number]

## 2010-06-08 NOTE — Procedures (Signed)
HISTORY OF PRESENT ILLNESS:  This is a 77 year old patient who was sent over  for an evaluation of seizure-type event associated with generalized jerking.  The patient does have a history of hypertension.  This is a routine EEG.  No  skull effects are noted.  Medications include Cartia, Atenolol,  multivitamins, aspirin.   EEG CLASSIFICATION:  Normal weight.   DESCRIPTION:  According to the background rhythms, this recording system of  a fairly well modulated medium amplitude alpha rhythm of 8 Hz with rapid  good eye opening and closure.  As the record progresses, the patient seems  to remain in awakened state throughout the entire recording.  Photic  stimulation is performed resulting in bilateral and symmetrical driving  response.  Hyperventilation was also performed resulting in a minimal  buildup to the background rhythm activity without significant slowing seen.  At no time during the recording did there appear to be spikes or spike wave  discharges or evidence of focal slowing.  EKG monitor shows no evidence of  cardiac arrhythmias with a heart rate of 66.   IMPRESSION:  This is a normal EEG recording in the waking state.  No  evidence of ictal or interictal discharges were seen.    Marlan Palau, M.D.   JWJ:XBJY  D:  07/27/2003 23:37:55  T:  07/28/2003 07:12:43  Job #:  [id number]

## 2010-06-08 NOTE — Op Note (Signed)
NAME:  Holly Taylor, Holly Taylor                          ACCOUNT NO.:  [id number]   MEDICAL RECORD NO.:  [id number]                   PATIENT TYPE:  OIB   LOCATION:  2888                                 FACILITY:  [facility]   PHYSICIAN:  Quita Skye. Hart Rochester, M.D.               DATE OF BIRTH:  08-19-1928   DATE OF PROCEDURE:  09/02/2002  DATE OF DISCHARGE:  09/02/2002                                 OPERATIVE REPORT   PREOPERATIVE DIAGNOSIS:  Severe claudication, both lower extremities,  secondary to aortoiliac occlusive disease.   POSTOPERATIVE DIAGNOSIS:  Severe claudication, both lower extremities,  secondary to aortoiliac occlusive disease.   PROCEDURE:  1. Abdominal aortogram with bilateral lower extremity run off via right     common femoral approach.  2. PTA and primary stenting of right common iliac artery stenosis using a 7-     mm x 24-mm Genesis system on Opta balloon at 10 atmospheres for 45     seconds.  3. PTA of right stented segment using an 8-mm x 2-cm Powerflex PTA catheter     at 12 atmospheres for 30 seconds.   SURGEON:  Dr. Hart Rochester.   ANESTHESIA:  Local.   Heparin 4,000 units.   COMPLICATIONS:  None.   DESCRIPTION OF PROCEDURE:  The patient was taken to [facility] peripheral  endovascular lab and placed in a supine position at which time both groins  were prepped with Betadine solution and draped in a routine sterile manner.  After infiltration of 1% Xylocaine, the right common femoral artery was  entered percutaneously, guide wire passed into the suprarenal aorta under  fluoroscopic guidance. A 5-French sheath and dilator were passed over the  guide wire, dilator removed, and standard pigtail catheter positioned in the  suprarenal aorta. Flush abdominal aortogram was performed, injecting 20 cc  of contrast at 20 cc per second, and this revealed the aorta to be widely  patent with single renal artery on the left and two renal arteries on the  right with the superior  most being the most dominant. The inferior  mesenteric artery was patent. The aorta tapered down at the bifurcation, and  the proximal right common iliac artery had a 90% stenosis over a 15-mm  segment, beginning at the origin, extending distally. Distally, the right  common internal and external iliac arteries were widely patent with the  exception of the distal right external iliac artery which did have plaque  but only about a 40% stenosis. On the left side, there was some diffuse  plaquing throughout the iliac system but no significant stenosis in the left  common internal or external iliac arteries. The catheter was withdrawn into  the terminal aorta, and RAO and LAO projections obtained to further  delineate the iliac lesions. The right common femoral artery had an  approximate 40% stenosis, just proximal to the aorta and the profunda. On  the  left side, there was essentially total occlusion of the common femoral  artery just distal to the level of the inguinal ligament with severe plaque  formation. There was some plaque in the external iliac proximal to this  point, but the vessel was patent. The right superficial femoral, popliteal,  and tibial vessels were patent, with the posterior tibial being the smallest  on the right, and the best vessel being the anterior tibial. On the left,  superficial femoral artery, popliteal artery, and the tibial vessels were  patent with three-vessel run off. Following this, the decision was made to  proceed with angioplasty and primary stenting of the right iliac artery, and  the 5 sheath was exchanged for a 6-French long sheath, and 4,000 units of  heparin given intravenously. Retrograde angiogram was performed through the  sheath to get better delineation of the iliac stenosis. A 7-mm x 24-mm  Genesis stent on the Opta catheter was utilized at 10 atmospheres for 45  seconds. Post angioplasty angiogram revealed some persistent narrowing in  the  level of the stent. Therefore a second angioplasty was performed using  an 8-mm x 2-cm Powerflex catheter at 12 atmospheres for 30 seconds. Second  angiogram revealed good cosmetic result across this iliac lesion. The guide  wire and angioplasty catheters were removed, and following correction of the  anticoagulation, the sheath was removed, adequate compression applied, and  no complications ensued.   FINDINGS:  1. Small aorta with 95% right common iliac stenosis.  2. Two renal arteries on the right, one renal artery on the left, widely     patent.  3. Near total occlusion, left common femoral artery.  4. Patent superficial femoral arteries, popliteal arteries, and tibial     vessels bilaterally with small right posterior tibial artery.  5. Successful PTA and primary stenting of right common iliac stenosis using     a 7 x 24 Genesis on Opta system and an 8 x 2 Powerflex catheter.                                               [name], M.D.    JDL/MEDQ  D:  09/02/2002  T:  09/03/2002  Job:  [id number]

## 2010-06-08 NOTE — H&P (Signed)
NAME:  Holly Taylor, Holly Taylor                          ACCOUNT NO.:  [id number]   MEDICAL RECORD NO.:  [id number]                   PATIENT TYPE:  INP   LOCATION:  NA                                   FACILITY:  [facility]   PHYSICIAN:  Quita Skye. Hart Rochester, M.D.               DATE OF BIRTH:  1929/01/15   DATE OF ADMISSION:  10/06/2002  DATE OF DISCHARGE:                                HISTORY & PHYSICAL   PRIMARY CARE PHYSICIAN:  Larina Earthly, M.D.   CHIEF COMPLAINT:  Peripheral vascular occlusive disease, left femoral  stenosis with claudication.   HISTORY OF PRESENT ILLNESS:  This is a 77 year old female who for several  months had been experiencing symptoms of claudication in both lower  extremities.  These symptoms began in the hip joints and then extended down  into the calves posteriorly. This inhibits from walking up inclines or  walking one block on level ground.  The patient denies rest pain and there  are no nonhealing ulcers present.  The symptoms are equally severe on both  the left and the right. The patient had a right iliac angioplasty and  stenting procedure on September 02, 2002.  Right leg claudication symptoms were  completely relieved, but the left leg symptoms continued due to left common  femoral stenosis which is almost totally occluded.  Dr. Hart Rochester recommended a  left femoral endarterectomy with patch angioplasty.   REVIEW OF SYSTEMS:  Negative for dyspnea on exertion, paroxysmal nocturnal  dyspnea, and GI or GU symptoms.  The patient denies a history of diabetes  mellitus, coronary artery disease, arrhythmias, CVA, and COPD.   PAST MEDICAL HISTORY:  1. Peripheral vascular occlusive disease.  2. Hypertension.   PAST SURGICAL HISTORY:  1. Anal fissure repair.  2. Cataract removal bilaterally in 1998.   MEDICATIONS:  1. Aspirin 325 mg one p.o. daily.  2. Multivitamins.  3. Atenolol 50 mg 1/2 tablet p.o. daily.  4. Cardia XT 240 mg one p.o. daily.   ALLERGIES:  No known  drug allergies.   REVIEW OF SYSTEMS:  See HPI for significant positives and negatives.   FAMILY HISTORY:  Mother is positive for diabetes mellitus.  Father is  positive for coronary artery disease and cerebrovascular accident.   SOCIAL HISTORY:  This is a widowed white female with three children. She is  retired.  The patient denies alcohol use and she is a current smoker. She  smokes one pack per day x50+ years.   PHYSICAL EXAMINATION:  VITAL SIGNS: Blood pressure 162/64, pulse 56,  respirations 18.  GENERAL: This is a white female in no acute distress. She is alert and  oriented x3.  HEENT:  Normocephalic and atraumatic.  Pupils equal, round, and reactive to  light and accommodation.  Extraocular movements are intact. There is no  evidence of cataracts or glaucoma.  NECK:  Supple. There is no jugular venous  distention and no bruits are  auscultated. There is no lymphadenopathy.  CHEST:  Symmetrical on inspiration.  LUNGS:  No wheezes, rhonchi, or rales.  HEART:  Regular rate and rhythm. No murmurs, rubs, or gallops.  ABDOMEN:  Soft and nontender. Bowel sounds are present x4.  There are no  masses and no bruits.  GENITOURINARY:  RECTAL: Deferred.  EXTREMITIES:  There is no cyanosis, clubbing, or edema or ulcerations.  Temperature is warm on the right and cool on the left. Peripheral pulses;  carotid 2+ bilaterally, femoral 1+ left and 2+ right, popliteal 1+ left and  2+ right, dorsalis pedis 1+ left and 2+ right, and posterior tibialis 1+  left and 2+ right.  NEUROLOGY:  The patient is intact. Gait is steady. Deep tendon reflexes is  2+. Muscle strength is 5/5.   ASSESSMENT:  Left femoral endarterectomy with patch angioplasty on October 06, 2002, at [facility].  Dr. Hart Rochester has seen and evaluated this  patient prior to this admission and has explained the risks and benefits of  the procedure and the patient has agreed to continue.      Pecola Leisure, PA                       Quita Skye. Hart Rochester, M.D.    AY/MEDQ  D:  10/04/2002  T:  10/04/2002  Job:  [id number]

## 2010-06-08 NOTE — Discharge Summary (Signed)
NAME:  Holly Taylor, Holly Taylor                          ACCOUNT NO.:  [id number]   MEDICAL RECORD NO.:  [id number]                   PATIENT TYPE:  INP   LOCATION:  2007                                 FACILITY:  [facility]   PHYSICIAN:  Quita Skye. Hart Rochester, M.D.               DATE OF BIRTH:  1928/06/26   DATE OF ADMISSION:  10/06/2002  DATE OF DISCHARGE:  10/08/2002                                 DISCHARGE SUMMARY   ADMISSION DIAGNOSIS:  Left common femoral occlusive disease with severe  claudication.   PAST MEDICAL HISTORY:  1. Peripheral vascular occlusive disease, status post right iliac     angioplasty and stenting, August 2004, with relief of claudication     symptoms on the right.  2. Hypertension.   PAST SURGICAL HISTORY:  1. Anal fissure repair.  2. Cataract removal bilaterally, in 1998.   ALLERGIES:  The patient has no known drug allergies.   DISCHARGE DIAGNOSIS:  Left common femoral occlusive disease with severe  claudication, status post left common superficial femoral endarterectomy.   BRIEF HISTORY:  Holly Taylor is a 77 year old female.  She was referred to Dr.  Hart Rochester for treatment of her bilateral claudication.  She underwent right  iliac angioplasty and stenting, in August 2004, with  complete relief of her  claudication symptoms on the right.  The left leg symptoms, however,  continued and are activity limiting.  Dr. Hart Rochester recommended proceeding with  a left femoral endarterectomy for relief of her symptoms.  The procedure's  risks and benefits were discussed with Holly Taylor and she agreed to proceed  with surgery.   HOSPITAL COURSE:  On October 13, 2002, Holly Taylor was electively admitted  to [facility] in the care of Dr. Jerilee Field.  She underwent the  following surgical procedure, left common and superficial femoral  endarterectomy with a Dacron patch angioplasty.  She tolerated the procedure  well, transferring in stable condition to the PACU.  She was  extubated  immediately following surgery.  She awoke from anesthesia neurologically  intact.  Her postoperative course was uneventful and she made very good  progress in recovering from her surgery.  The morning of postoperative day  two, Holly Taylor reported feeling very well.  Her blood pressure was 121/51.  She was afebrile, although, she had a 101.8 temp at midnight.  Her room air  saturation was 92%.  Her heart maintained a normal sinus rhythm throughout  her hospitalization.  Lungs were clear.  By postoperative day two, she was  tolerating her diet without nausea.  Urine output was adequate.  Her left  groin incision was clean and dry.  Steri-Strips were intact.  She had a  palpable left dorsalis pedis pulse.  No lower extremity edema.  She was  ambulating in the hall with minimal difficulty.  Because of her elevated  temp overnight, she was kept in the  hospital for the morning.  She continued  to use her incentive spirometer also increased her ambulation.  She remained  afebrile throughout the morning and was discharged later in the afternoon.  Smoking cessation was discussed with Holly Taylor however, she stated she is  not sure if she was ready to quit yet.   CONDITION ON DISCHARGE:  Improved.   INSTRUCTIONS ON DISCHARGE:  Include medications:  1. She is instructed to resume her home medications of:  Atenolol 25 mg     every day.  2. Cartia XL 240 mg every day.  3. Aspirin 325 mg every day.  4. Multivitamin daily.  5. For pain management she may have Tylox 1-2 p.o. q.4-6h. p.r.n.   ACTIVITY:  No driving or any heavy lifting.  She is also instructed to  continue her breathing exercises and daily walking.  She has been encouraged  to stop smoking.   DIET:  Should continue to be a heart healthy diet.   WOUND CARE:  She is to clean her incision daily with mild soap and water and  also to keep a dry gauze covering the groin incision.  She may shower  beginning Sunday, October 10, 2002.   FOLLOWUP:  Dr. Hart Rochester would like to see her at the [facility] office on Tuesday,  November 02, 2002 at 2 p.m.        Toribio Harbour, N.P.                  [name], M.D.    CTK/MEDQ  D:  10/16/2002  T:  10/17/2002  Job:  [id number]   cc:   Larina Earthly, M.D.  [address]  [place]  [place] [address]  Fax: [phone]

## 2010-11-05 LAB — BASIC METABOLIC PANEL
BUN: 6
BUN: 7
CO2: 24
CO2: 26
CO2: 27
Calcium: 8.6
Calcium: 8.6
Creatinine, Ser: 0.72
GFR calc Af Amer: >60
GFR calc non Af Amer: >60
GFR calc non Af Amer: >60
GFR calc non Af Amer: 58 — ABNORMAL LOW
Glucose, Bld: 141 — ABNORMAL HIGH
Glucose, Bld: 201 — ABNORMAL HIGH
Potassium: 3.9
Potassium: 4.2
Sodium: 127 — ABNORMAL LOW
Sodium: 133 — ABNORMAL LOW
Sodium: 130 — ABNORMAL LOW

## 2010-11-05 LAB — TSH: TSH: 2.03

## 2010-11-05 LAB — CARDIAC PANEL(CRET KIN+CKTOT+MB+TROPI)
CK, MB: 2.5
Relative Index: INVALID
Total CK: 80
Total CK: 51

## 2010-11-05 LAB — ABO/RH: ABO/RH(D): A POS

## 2010-11-05 LAB — CBC
HCT: 31.8 — ABNORMAL LOW
Hemoglobin: 10.9 — ABNORMAL LOW
Hemoglobin: 13
Hemoglobin: 9.7 — ABNORMAL LOW
MCHC: 33.2
MCHC: 33.6
MCHC: 33.8
MCHC: 33.8
MCV: 90.1
Platelets: 145 — ABNORMAL LOW
Platelets: 242
RBC: 3.64 — ABNORMAL LOW
RBC: 3.65 — ABNORMAL LOW
RDW: 12.1
RDW: 12.2
RDW: 12.2
WBC: 12 — ABNORMAL HIGH
WBC: 12.5 — ABNORMAL HIGH

## 2010-11-05 LAB — TYPE AND SCREEN

## 2010-11-05 LAB — HEPATIC FUNCTION PANEL
ALT: 13
AST: 21
Albumin: 3.3 — ABNORMAL LOW
Alkaline Phosphatase: 50
Bilirubin, Direct: 0.2
Total Bilirubin: 1

## 2010-11-05 LAB — MAGNESIUM: Magnesium: 1.9

## 2010-11-05 LAB — COMPREHENSIVE METABOLIC PANEL
ALT: 16
Albumin: 3.9
Calcium: 9.5
Glucose, Bld: 94
Sodium: 133 — ABNORMAL LOW
Total Protein: 7.2

## 2010-11-05 LAB — POCT I-STAT 4, (NA,K, GLUC, HGB,HCT)
Glucose, Bld: 96
Operator id: 219281
Potassium: 4

## 2010-11-05 LAB — LIPID PANEL
Cholesterol: 94
HDL: 37 — ABNORMAL LOW
LDL Cholesterol: 48
Total CHOL/HDL Ratio: 2.5

## 2010-11-05 LAB — PROTIME-INR
INR: 1
Prothrombin Time: 13.1

## 2010-11-15 ENCOUNTER — Telehealth: Payer: Self-pay | Admitting: Cardiology

## 2010-11-15 NOTE — Telephone Encounter (Signed)
LOV faxed to [facility] @ [phone]  11/15/10/km

## 2011-03-16 ENCOUNTER — Other Ambulatory Visit: Payer: Self-pay | Admitting: Cardiology

## 2011-03-21 ENCOUNTER — Other Ambulatory Visit: Payer: Self-pay | Admitting: Cardiology

## 2011-03-21 DIAGNOSIS — I6529 Occlusion and stenosis of unspecified carotid artery: Secondary | ICD-10-CM

## 2011-04-03 ENCOUNTER — Encounter (INDEPENDENT_AMBULATORY_CARE_PROVIDER_SITE_OTHER): Payer: Medicare Other

## 2011-04-03 DIAGNOSIS — I6529 Occlusion and stenosis of unspecified carotid artery: Secondary | ICD-10-CM

## 2011-07-15 ENCOUNTER — Emergency Department (HOSPITAL_COMMUNITY): Payer: PRIVATE HEALTH INSURANCE

## 2011-07-15 ENCOUNTER — Encounter (HOSPITAL_COMMUNITY): Payer: Self-pay | Admitting: *Deleted

## 2011-07-15 ENCOUNTER — Inpatient Hospital Stay (HOSPITAL_COMMUNITY)
Admission: EM | Admit: 2011-07-15 | Discharge: 2011-07-17 | DRG: 066 | Disposition: A | Discharge status: Home / Self Care | Payer: PRIVATE HEALTH INSURANCE | Attending: Neurosurgery | Admitting: Neurosurgery

## 2011-07-15 DIAGNOSIS — S40029A Contusion of unspecified upper arm, initial encounter: Secondary | ICD-10-CM | POA: Diagnosis present

## 2011-07-15 DIAGNOSIS — S60229A Contusion of unspecified hand, initial encounter: Secondary | ICD-10-CM | POA: Diagnosis present

## 2011-07-15 DIAGNOSIS — I6200 Nontraumatic subdural hemorrhage, unspecified: Principal | ICD-10-CM | POA: Diagnosis present

## 2011-07-15 DIAGNOSIS — Y998 Other external cause status: Secondary | ICD-10-CM

## 2011-07-15 DIAGNOSIS — I6529 Occlusion and stenosis of unspecified carotid artery: Secondary | ICD-10-CM | POA: Diagnosis present

## 2011-07-15 DIAGNOSIS — I10 Essential (primary) hypertension: Secondary | ICD-10-CM | POA: Diagnosis present

## 2011-07-15 DIAGNOSIS — Z7982 Long term (current) use of aspirin: Secondary | ICD-10-CM

## 2011-07-15 DIAGNOSIS — S065X9A Traumatic subdural hemorrhage with loss of consciousness of unspecified duration, initial encounter: Secondary | ICD-10-CM

## 2011-07-15 DIAGNOSIS — I739 Peripheral vascular disease, unspecified: Secondary | ICD-10-CM | POA: Diagnosis present

## 2011-07-15 DIAGNOSIS — F17200 Nicotine dependence, unspecified, uncomplicated: Secondary | ICD-10-CM | POA: Diagnosis present

## 2011-07-15 DIAGNOSIS — Y92410 Unspecified street and highway as the place of occurrence of the external cause: Secondary | ICD-10-CM

## 2011-07-15 DIAGNOSIS — Z79899 Other long term (current) drug therapy: Secondary | ICD-10-CM

## 2011-07-15 DIAGNOSIS — E785 Hyperlipidemia, unspecified: Secondary | ICD-10-CM | POA: Diagnosis present

## 2011-07-15 DIAGNOSIS — S301XXA Contusion of abdominal wall, initial encounter: Secondary | ICD-10-CM

## 2011-07-15 DIAGNOSIS — I2510 Atherosclerotic heart disease of native coronary artery without angina pectoris: Secondary | ICD-10-CM | POA: Diagnosis present

## 2011-07-15 HISTORY — DX: Shortness of breath: R06.02

## 2011-07-15 LAB — COMPREHENSIVE METABOLIC PANEL
Alkaline Phosphatase: 56 U/L (ref 39–117)
BUN: 9 mg/dL (ref 6–23)
Calcium: 9.5 mg/dL (ref 8.4–10.5)
GFR calc Af Amer: 79 mL/min — ABNORMAL LOW (ref >90)
Glucose, Bld: 112 mg/dL — ABNORMAL HIGH (ref 70–99)
Total Protein: 6.4 g/dL (ref 6.0–8.3)

## 2011-07-15 LAB — CBC
HCT: 36.5 % (ref 36.0–46.0)
Hemoglobin: 12.8 g/dL (ref 12.0–15.0)
MCH: 31.1 pg (ref 26.0–34.0)
MCHC: 35.1 g/dL (ref 30.0–36.0)

## 2011-07-15 MED ORDER — SODIUM CHLORIDE 0.9 % IV BOLUS (SEPSIS): 500.0000 mL | Freq: Once | INTRAVENOUS | Status: DC

## 2011-07-15 MED ORDER — IOHEXOL 300 MG/ML  SOLN
100.0000 mL | Freq: Once | INTRAMUSCULAR | Status: AC | PRN
  Administered 2011-07-15: 100 mL via INTRAVENOUS

## 2011-07-15 NOTE — ED Notes (Signed)
Patient transported to CT 

## 2011-07-15 NOTE — ED Notes (Signed)
nerosurgery MD at bedside.
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
## 2011-07-16 ENCOUNTER — Encounter (HOSPITAL_COMMUNITY): Payer: Self-pay | Admitting: *Deleted

## 2011-07-16 ENCOUNTER — Inpatient Hospital Stay (HOSPITAL_COMMUNITY): Payer: PRIVATE HEALTH INSURANCE

## 2011-07-16 DIAGNOSIS — S065X9A Traumatic subdural hemorrhage with loss of consciousness of unspecified duration, initial encounter: Secondary | ICD-10-CM

## 2011-07-16 DIAGNOSIS — S8000XA Contusion of unspecified knee, initial encounter: Secondary | ICD-10-CM

## 2011-07-16 LAB — BASIC METABOLIC PANEL
CO2: 23 mEq/L (ref 19–32)
Calcium: 8.8 mg/dL (ref 8.4–10.5)
Chloride: 93 mEq/L — ABNORMAL LOW (ref 96–112)
Glucose, Bld: 115 mg/dL — ABNORMAL HIGH (ref 70–99)
Sodium: 127 mEq/L — ABNORMAL LOW (ref 135–145)

## 2011-07-16 LAB — CBC
Hemoglobin: 11.2 g/dL — ABNORMAL LOW (ref 12.0–15.0)
MCH: 30.6 pg (ref 26.0–34.0)
RBC: 3.66 MIL/uL — ABNORMAL LOW (ref 3.87–5.11)

## 2011-07-16 MED ORDER — HYDROCODONE-ACETAMINOPHEN 5-325 MG PO TABS
1.0000 | ORAL_TABLET | ORAL | Status: DC | PRN
  Administered 2011-07-16: 1 via ORAL
  Filled 2011-07-16: qty 1

## 2011-07-16 MED ORDER — SODIUM CHLORIDE 0.9 % IV SOLN
INTRAVENOUS | Status: DC
  Administered 2011-07-16: 01:00:00 via INTRAVENOUS
  Filled 2011-07-16 (×2): qty 1000

## 2011-07-16 MED ORDER — ACETAMINOPHEN 325 MG PO TABS: 650.0000 mg | ORAL_TABLET | ORAL | Status: DC | PRN

## 2011-07-16 MED ORDER — ONDANSETRON HCL 4 MG PO TABS: 4.0000 mg | ORAL_TABLET | Freq: Four times a day (QID) | ORAL | Status: DC | PRN

## 2011-07-16 MED ORDER — AMLODIPINE BESYLATE 5 MG PO TABS
5.0000 mg | ORAL_TABLET | Freq: Every day | ORAL | Status: DC
  Administered 2011-07-16: 5 mg via ORAL
  Filled 2011-07-16 (×2): qty 1

## 2011-07-16 MED ORDER — SIMVASTATIN 20 MG PO TABS
20.0000 mg | ORAL_TABLET | Freq: Every day | ORAL | Status: DC
  Administered 2011-07-16: 20 mg via ORAL
  Filled 2011-07-16 (×2): qty 1

## 2011-07-16 MED ORDER — NICOTINE 14 MG/24HR TD PT24
14.0000 mg | MEDICATED_PATCH | Freq: Every day | TRANSDERMAL | Status: DC
  Administered 2011-07-16: 14 mg via TRANSDERMAL
  Filled 2011-07-16 (×2): qty 1

## 2011-07-16 MED ORDER — METOPROLOL SUCCINATE ER 25 MG PO TB24
25.0000 mg | ORAL_TABLET | Freq: Every day | ORAL | Status: DC
  Administered 2011-07-16: 25 mg via ORAL
  Filled 2011-07-16 (×2): qty 1

## 2011-07-16 MED ORDER — ONDANSETRON HCL 4 MG/2ML IJ SOLN: 4.0000 mg | Freq: Four times a day (QID) | INTRAMUSCULAR | Status: DC | PRN

## 2011-07-16 MED ORDER — ADULT MULTIVITAMIN W/MINERALS CH
1.0000 | ORAL_TABLET | Freq: Every day | ORAL | Status: DC
  Administered 2011-07-16: 1 via ORAL
  Filled 2011-07-16 (×2): qty 1

## 2011-07-16 MED ORDER — ZOLPIDEM TARTRATE 5 MG PO TABS
5.0000 mg | ORAL_TABLET | Freq: Every evening | ORAL | Status: DC | PRN
  Administered 2011-07-16: 5 mg via ORAL
  Filled 2011-07-16: qty 1
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
## 2011-07-17 ENCOUNTER — Inpatient Hospital Stay (HOSPITAL_COMMUNITY): Payer: PRIVATE HEALTH INSURANCE

## 2011-07-17 ENCOUNTER — Encounter (HOSPITAL_COMMUNITY): Payer: Self-pay | Admitting: Radiology
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
## 2011-07-17 NOTE — Progress Notes (Signed)
Patient discharge instructions and education given to patient and family. All questions answered to patient's satisfaction. Pt D/C home with no signs of acute distress.

## 2011-07-24 ENCOUNTER — Other Ambulatory Visit (HOSPITAL_COMMUNITY): Payer: Self-pay | Admitting: Neurosurgery

## 2011-07-24 DIAGNOSIS — S066X0A Traumatic subarachnoid hemorrhage without loss of consciousness, initial encounter: Secondary | ICD-10-CM

## 2011-08-04 ENCOUNTER — Other Ambulatory Visit: Payer: Self-pay | Admitting: Cardiology

## 2011-08-07 ENCOUNTER — Ambulatory Visit (HOSPITAL_COMMUNITY)
Admission: RE | Admit: 2011-08-07 | Discharge: 2011-08-07 | Disposition: A | Discharge status: Home / Self Care | Payer: Medicare Other | Source: Ambulatory Visit | Attending: Neurosurgery | Admitting: Neurosurgery

## 2011-08-07 DIAGNOSIS — S066X0A Traumatic subarachnoid hemorrhage without loss of consciousness, initial encounter: Secondary | ICD-10-CM | POA: Insufficient documentation

## 2011-08-07 DIAGNOSIS — I7090 Unspecified atherosclerosis: Secondary | ICD-10-CM | POA: Insufficient documentation

## 2011-08-07 DIAGNOSIS — X58XXXA Exposure to other specified factors, initial encounter: Secondary | ICD-10-CM | POA: Insufficient documentation

## 2011-08-09 ENCOUNTER — Encounter: Payer: Self-pay | Admitting: Vascular Surgery

## 2011-10-29 ENCOUNTER — Other Ambulatory Visit: Payer: Self-pay | Admitting: Cardiology

## 2012-01-31 ENCOUNTER — Other Ambulatory Visit: Payer: Self-pay | Admitting: *Deleted

## 2012-01-31 MED ORDER — METOPROLOL SUCCINATE ER 25 MG PO TB24: 25.0000 mg | ORAL_TABLET | Freq: Every day | ORAL | Status: DC

## 2012-05-10 ENCOUNTER — Other Ambulatory Visit: Payer: Self-pay | Admitting: Cardiology

## 2012-06-29 ENCOUNTER — Other Ambulatory Visit: Payer: Self-pay | Admitting: Cardiology

## 2012-07-02 ENCOUNTER — Encounter: Payer: Self-pay | Admitting: Cardiology

## 2012-09-07 ENCOUNTER — Other Ambulatory Visit: Payer: Self-pay | Admitting: Cardiology

## 2012-10-07 ENCOUNTER — Other Ambulatory Visit: Payer: Self-pay | Admitting: Cardiology

## 2012-11-03 ENCOUNTER — Encounter: Payer: Self-pay | Admitting: Cardiology

## 2012-11-03 ENCOUNTER — Encounter (INDEPENDENT_AMBULATORY_CARE_PROVIDER_SITE_OTHER): Payer: Self-pay

## 2012-11-03 ENCOUNTER — Ambulatory Visit (INDEPENDENT_AMBULATORY_CARE_PROVIDER_SITE_OTHER): Payer: Medicare Other | Admitting: Cardiology

## 2012-11-03 VITALS — BP 128/60 | HR 75 | Ht 67.0 in | Wt 151.0 lb

## 2012-11-03 DIAGNOSIS — I6529 Occlusion and stenosis of unspecified carotid artery: Secondary | ICD-10-CM

## 2012-11-03 DIAGNOSIS — I2510 Atherosclerotic heart disease of native coronary artery without angina pectoris: Secondary | ICD-10-CM

## 2012-11-03 DIAGNOSIS — I739 Peripheral vascular disease, unspecified: Secondary | ICD-10-CM

## 2012-11-03 LAB — BASIC METABOLIC PANEL
BUN: 6 mg/dL (ref 6–23)
CO2: 26 mEq/L (ref 19–32)
Calcium: 9 mg/dL (ref 8.4–10.5)
Chloride: 94 mEq/L — ABNORMAL LOW (ref 96–112)
Creatinine, Ser: 0.6 mg/dL (ref 0.4–1.2)
GFR: 96.09 mL/min (ref >60.00)
Glucose, Bld: 93 mg/dL (ref 70–99)
Potassium: 4.2 mEq/L (ref 3.5–5.1)
Sodium: 127 mEq/L — ABNORMAL LOW (ref 135–145)

## 2012-11-03 LAB — HEPATIC FUNCTION PANEL
ALT: 17 U/L (ref 0–35)
AST: 22 U/L (ref 0–37)
Albumin: 3.6 g/dL (ref 3.5–5.2)
Alkaline Phosphatase: 47 U/L (ref 39–117)
Bilirubin, Direct: 0 mg/dL (ref 0.0–0.3)
Total Bilirubin: 0.5 mg/dL (ref 0.3–1.2)
Total Protein: 6 g/dL (ref 6.0–8.3)

## 2012-11-03 MED ORDER — CILOSTAZOL 100 MG PO TABS: 100.0000 mg | ORAL_TABLET | Freq: Two times a day (BID) | ORAL | Status: DC

## 2012-11-03 NOTE — Progress Notes (Signed)
Patient ID: Holly Taylor, female   DOB: Jun 14, 1928, 79 y.o.   MRN: [id number]    Patient Name: Holly Taylor Date of Encounter: 11/03/2012  Primary Care Provider:  Hoyle Sauer, MD Primary Cardiologist:  Tobias Alexander, H  Patient Profile  PAD, follow up  Problem List   Past Medical History  Diagnosis Date  . Coronary artery disease   . Hyperlipidemia   . Hypertension   . Peripheral vascular disease   . Cerebrovascular disease   . Carotid artery disease   . Shortness of breath    Past Surgical History  Procedure Laterality Date  . Femoral pseudoaneurysm      repair of right   . Pseudoaneurysm repair    . Aortogram    . Abdomina aorto    . Endarterectomy     Allergies  No Known Allergies  HPI  Holly Taylor returns today for 2 year follow up for her peripheral arterial disease. She reports no change. She is still smoking a pack a day. No chest pain, dizziness, SOB. She complains of calves pain while walking in her back yard, but it is unchanged for the last couple of years. She denies presyncope or syncope.  Home Medications  Prior to Admission medications   Medication Sig Start Date End Date Taking? Authorizing Provider  amLODipine (NORVASC) 5 MG tablet Take 5 mg by mouth daily.     Yes Historical Provider, MD  metoprolol succinate (TOPROL-XL) 25 MG 24 hr tablet Take 1 tablet (25 mg total) by mouth daily. 10/07/12  Yes Lars Masson, MD  Multiple Vitamin (MULTIVITAMIN) tablet Take 1 tablet by mouth daily.     Yes Historical Provider, MD  simvastatin (ZOCOR) 20 MG tablet Take 20 mg by mouth at bedtime.     Yes Historical Provider, MD  zolpidem (AMBIEN) 10 MG tablet Take 10 mg by mouth at bedtime as needed.     Yes Historical Provider, MD   Past Medical History:   Last updated: 04/06/2009  CAD, NATIVE VESSEL (ICD-414.01)  HYPERLIPIDEMIA (ICD-272.4)  HYPERTENSION (ICD-401.9)  PERIPHERAL VASCULAR DISEASE (ICD-443.9)  CEREBROVASCULAR DISEASE (ICD-437.9)    CAROTID ARTERY DISEASE (ICD-433.10)  Past Surgical History:  Last updated: 04/06/2009  Repair of right femoral pseudoaneurysm.  1. Attempted cannulation left common femoral artery with inability to  pass guidewire proximally through iliac system.  2. Abdominal aortogram with bilateral lower extremity runoff via right  common femoral approach.  Left common femoral and proximal superficial femoral  endarterectomy with Dacron patch angioplasty.   Family History  Family History  Problem Relation Age of Onset  . Coronary artery disease      negative family hx of  . Cancer Brother     Social History  History   Social History  . Marital Status: Widowed    Spouse Name: N/A    Number of Children: N/A  . Years of Education: N/A   Occupational History  . retired    Social History Main Topics  . Smoking status: Current Every Day Smoker -- 0.50 packs/day    Types: Cigarettes  . Smokeless tobacco: Not on file     Comment: 1 1/2 packs per day, for about 60 years  . Alcohol Use: No  . Drug Use: No  . Sexual Activity: Not on file   Other Topics Concern  . Not on file   Social History Narrative  . No narrative on file     Review of Systems General:  No chills,  fever, night sweats or weight changes.  Cardiovascular:  No chest pain, dyspnea on exertion, edema, orthopnea, palpitations, paroxysmal nocturnal dyspnea. + claudications Dermatological: No rash, lesions/masses Respiratory: No cough, dyspnea Urologic: No hematuria, dysuria Abdominal:   No nausea, vomiting, diarrhea, bright red blood per rectum, melena, or hematemesis Neurologic:  No visual changes, wkns, changes in mental status. All other systems reviewed and are otherwise negative except as noted above.  Physical Exam  Blood pressure 128/60, pulse 75, height 5\' 7"  (1.702 m), weight 151 lb (68.493 kg).  General: Pleasant, NAD Psych: Normal affect. Neuro: Alert and oriented X 3. Moves all extremities  spontaneously. HEENT: Normal  Neck: Supple without bruits or JVD. Lungs:  Resp regular and unlabored, CTA. Heart: RRR no s3, s4, or murmurs. Abdomen: Soft, non-tender, non-distended, BS + x 4.  Extremities: No clubbing, cyanosis or edema. DP/PT weak B/L, Radials 2+ and equal bilaterally.  Accessory Clinical Findings  ECG - SR, 75 BPM, 1.AVB  TTE 08/21/2006 SUMMARY - Limited study due to poor acoustic windows. - Overall left ventricular systolic function was at the lower limits of normal. Left ventricular ejection fraction was estimated , range being 50 % to 55 %. This study was inadequate for the evaluation of left ventricular regional wall motion. Left ventricular wall thickness was mildly increased. - Aortic valve thickness was mildly increased. - The inferior vena cava was dilated. - There was a minimal pericardial effusion versus epicardial adipose tissue.    Assessment & Plan  79 year old female with PAD  1. Carotid disease - the last carotid Duplex in 03/2011 with stable 1-39% in RICA and 40-59% in LICA, antegrade flow in b/l vertebral arteries. Recommended follow up scan in 2 years. She is asymptomatic, no signs of TIA's. We will follow her in 6 months with repeat carotod Duplex.  2. PAD - lower extremity - stable claudications on ASA, we will add Cilostazol 100 mg po BID. Check liver function test (contraindicated in severe impairement)  3. HTN - controlled on current regimen  4. HLP - on simvastatin - the patient prefers to be checked at her PCP office  Follow up in 6 months  Tobias Alexander, Rexene Edison, MD 11/03/2012, 2:15 PM

## 2012-11-03 NOTE — Patient Instructions (Addendum)
START PLETAL 100 MG TWICE A DAY  Your physician wants you to follow-up in: 6 MONTHS  You will receive a reminder letter in the mail two months in advance. If you don't receive a letter, please call our office to schedule the follow-up appointment.   Will obtain labs today and call you with the results (CMET)

## 2012-11-06 ENCOUNTER — Telehealth: Payer: Self-pay

## 2012-11-06 NOTE — Telephone Encounter (Signed)
LMTCB.  Per Dr Delton See pts labs are ok and she is not making any changes at this time.

## 2012-11-10 NOTE — Telephone Encounter (Signed)
Patient notified of her lab results and that Dr. Delton See stated that the labs are "ok and is not making any changes at this time" in her current treatment plan. Patient verbalized appreciation and agreement with current treatment plan.

## 2012-11-10 NOTE — Telephone Encounter (Signed)
Follow Up ° °Pt returned call//  °

## 2012-12-15 ENCOUNTER — Other Ambulatory Visit: Payer: Self-pay

## 2012-12-15 DIAGNOSIS — I2510 Atherosclerotic heart disease of native coronary artery without angina pectoris: Secondary | ICD-10-CM

## 2012-12-15 MED ORDER — CILOSTAZOL 100 MG PO TABS: 100.0000 mg | ORAL_TABLET | Freq: Two times a day (BID) | ORAL | Status: DC

## 2012-12-15 MED ORDER — METOPROLOL SUCCINATE ER 25 MG PO TB24: 25.0000 mg | ORAL_TABLET | Freq: Every day | ORAL | Status: DC

## 2013-02-17 ENCOUNTER — Encounter: Payer: Self-pay | Admitting: Cardiology

## 2013-09-02 ENCOUNTER — Other Ambulatory Visit: Payer: Self-pay | Admitting: Internal Medicine

## 2013-09-02 DIAGNOSIS — I951 Orthostatic hypotension: Secondary | ICD-10-CM

## 2013-09-02 DIAGNOSIS — I739 Peripheral vascular disease, unspecified: Principal | ICD-10-CM

## 2013-09-02 DIAGNOSIS — W1789XA Other fall from one level to another, initial encounter: Secondary | ICD-10-CM

## 2013-09-02 DIAGNOSIS — R413 Other amnesia: Secondary | ICD-10-CM

## 2013-09-02 DIAGNOSIS — I779 Disorder of arteries and arterioles, unspecified: Secondary | ICD-10-CM

## 2013-09-09 ENCOUNTER — Ambulatory Visit
Admission: RE | Admit: 2013-09-09 | Discharge: 2013-09-09 | Disposition: A | Discharge status: Home / Self Care | Payer: Medicare Other | Source: Ambulatory Visit | Attending: Internal Medicine | Admitting: Internal Medicine

## 2013-09-09 DIAGNOSIS — I779 Disorder of arteries and arterioles, unspecified: Secondary | ICD-10-CM

## 2013-09-09 DIAGNOSIS — W1789XA Other fall from one level to another, initial encounter: Secondary | ICD-10-CM

## 2013-09-09 DIAGNOSIS — I739 Peripheral vascular disease, unspecified: Principal | ICD-10-CM

## 2013-09-09 DIAGNOSIS — I951 Orthostatic hypotension: Secondary | ICD-10-CM

## 2013-09-09 DIAGNOSIS — R413 Other amnesia: Secondary | ICD-10-CM

## 2013-09-10 ENCOUNTER — Other Ambulatory Visit: Payer: Self-pay | Admitting: Internal Medicine

## 2013-09-10 ENCOUNTER — Ambulatory Visit
Admission: RE | Admit: 2013-09-10 | Discharge: 2013-09-10 | Disposition: A | Discharge status: Home / Self Care | Payer: PRIVATE HEALTH INSURANCE | Source: Ambulatory Visit | Attending: Internal Medicine | Admitting: Internal Medicine

## 2013-09-10 ENCOUNTER — Encounter (HOSPITAL_COMMUNITY): Payer: Self-pay | Admitting: Emergency Medicine

## 2013-09-10 ENCOUNTER — Inpatient Hospital Stay (HOSPITAL_COMMUNITY)
Admission: EM | Admit: 2013-09-10 | Discharge: 2013-09-13 | DRG: 055 | Disposition: A | Discharge status: Hospice | Payer: PRIVATE HEALTH INSURANCE | Attending: Family Medicine | Admitting: Family Medicine

## 2013-09-10 DIAGNOSIS — R634 Abnormal weight loss: Secondary | ICD-10-CM | POA: Diagnosis present

## 2013-09-10 DIAGNOSIS — C7949 Secondary malignant neoplasm of other parts of nervous system: Principal | ICD-10-CM

## 2013-09-10 DIAGNOSIS — I739 Peripheral vascular disease, unspecified: Secondary | ICD-10-CM | POA: Diagnosis present

## 2013-09-10 DIAGNOSIS — Z66 Do not resuscitate: Secondary | ICD-10-CM | POA: Diagnosis present

## 2013-09-10 DIAGNOSIS — Z9181 History of falling: Secondary | ICD-10-CM

## 2013-09-10 DIAGNOSIS — I2510 Atherosclerotic heart disease of native coronary artery without angina pectoris: Secondary | ICD-10-CM | POA: Diagnosis present

## 2013-09-10 DIAGNOSIS — I10 Essential (primary) hypertension: Secondary | ICD-10-CM | POA: Diagnosis present

## 2013-09-10 DIAGNOSIS — C7931 Secondary malignant neoplasm of brain: Secondary | ICD-10-CM | POA: Diagnosis not present

## 2013-09-10 DIAGNOSIS — C801 Malignant (primary) neoplasm, unspecified: Secondary | ICD-10-CM | POA: Diagnosis present

## 2013-09-10 DIAGNOSIS — Z808 Family history of malignant neoplasm of other organs or systems: Secondary | ICD-10-CM

## 2013-09-10 DIAGNOSIS — Z7982 Long term (current) use of aspirin: Secondary | ICD-10-CM

## 2013-09-10 DIAGNOSIS — G9389 Other specified disorders of brain: Secondary | ICD-10-CM | POA: Diagnosis present

## 2013-09-10 DIAGNOSIS — F17200 Nicotine dependence, unspecified, uncomplicated: Secondary | ICD-10-CM | POA: Diagnosis present

## 2013-09-10 DIAGNOSIS — G939 Disorder of brain, unspecified: Secondary | ICD-10-CM | POA: Diagnosis not present

## 2013-09-10 DIAGNOSIS — I629 Nontraumatic intracranial hemorrhage, unspecified: Secondary | ICD-10-CM

## 2013-09-10 DIAGNOSIS — I679 Cerebrovascular disease, unspecified: Secondary | ICD-10-CM

## 2013-09-10 DIAGNOSIS — I6529 Occlusion and stenosis of unspecified carotid artery: Secondary | ICD-10-CM | POA: Diagnosis present

## 2013-09-10 DIAGNOSIS — Z515 Encounter for palliative care: Secondary | ICD-10-CM

## 2013-09-10 DIAGNOSIS — E785 Hyperlipidemia, unspecified: Secondary | ICD-10-CM | POA: Diagnosis present

## 2013-09-10 DIAGNOSIS — Z23 Encounter for immunization: Secondary | ICD-10-CM

## 2013-09-10 MED ORDER — GADOBENATE DIMEGLUMINE 529 MG/ML IV SOLN
15.0000 mL | Freq: Once | INTRAVENOUS | Status: AC | PRN
  Administered 2013-09-10: 15 mL via INTRAVENOUS

## 2013-09-10 NOTE — ED Provider Notes (Signed)
CSN: [id number]     Arrival date & time 09/10/13  2311 History   First MD Initiated Contact with Patient 09/10/13 2339     Chief Complaint  Patient presents with  . Brain Tumor     (Consider location/radiation/quality/duration/timing/severity/associated sxs/prior Treatment) HPI This is a 80 year old female who lives at home. Evaluated by her primary doctor for recent falls. MRI today revealed greater than 20 brain masses. She's also advised to present to the emergency department for admission and possible steroids. Patient denies any headache or recent vision changes. Family states her mental status has been normal. She was driving last week without difficulty. She's had no nausea or vomiting. Denies any chest pain or abdominal pain. Patient denies any lightheadedness or dizziness. Past Medical History  Diagnosis Date  . Coronary artery disease   . Hyperlipidemia   . Hypertension   . Peripheral vascular disease   . Cerebrovascular disease   . Carotid artery disease   . Shortness of breath    Past Surgical History  Procedure Laterality Date  . Femoral pseudoaneurysm      repair of right   . Pseudoaneurysm repair    . Aortogram    . Abdomina aorto    . Endarterectomy     Family History  Problem Relation Age of Onset  . Coronary artery disease      negative family hx of  . Cancer Brother    History  Substance Use Topics  . Smoking status: Current Every Day Smoker -- 0.50 packs/day    Types: Cigarettes  . Smokeless tobacco: Not on file     Comment: 1 1/2 packs per day, for about 60 years  . Alcohol Use: No   OB History   Grav Para Term Preterm Abortions TAB SAB Ect Mult Living                 Review of Systems  Constitutional: Negative for fever and chills.  Eyes: Negative for visual disturbance.  Respiratory: Negative for cough and shortness of breath.   Cardiovascular: Negative for chest pain and palpitations.  Gastrointestinal: Negative for nausea, vomiting and  abdominal pain.  Musculoskeletal: Negative for back pain, neck pain and neck stiffness.  Skin: Negative for rash and wound.  Neurological: Negative for dizziness, syncope, weakness, light-headedness, numbness and headaches.  All other systems reviewed and are negative.     Allergies  Review of patient's allergies indicates no known allergies.  Home Medications   Prior to Admission medications   Medication Sig Start Date End Date Taking? Authorizing Provider  amLODipine (NORVASC) 5 MG tablet Take 5 mg by mouth daily.      Historical Provider, MD  aspirin 325 MG tablet Take 325 mg by mouth daily.    Historical Provider, MD  cilostazol (PLETAL) 100 MG tablet Take 1 tablet (100 mg total) by mouth 2 (two) times daily. 12/15/12   Dorothy Spark, MD  metoprolol succinate (TOPROL-XL) 25 MG 24 hr tablet Take 1 tablet (25 mg total) by mouth daily. 12/15/12   Dorothy Spark, MD  Multiple Vitamin (MULTIVITAMIN) tablet Take 1 tablet by mouth daily.      Historical Provider, MD  simvastatin (ZOCOR) 20 MG tablet Take 20 mg by mouth at bedtime.      Historical Provider, MD  zolpidem (AMBIEN) 10 MG tablet Take 10 mg by mouth at bedtime as needed.      Historical Provider, MD   BP 120/90  Pulse 80  Temp(Src) 98.8  F (37.1 C) (Oral)  Resp 22  SpO2 99% Physical Exam  Nursing note and vitals reviewed. Constitutional: She is oriented to person, place, and time. She appears well-developed and well-nourished. No distress.  HENT:  Head: Normocephalic and atraumatic.  Mouth/Throat: Oropharynx is clear and moist.  Eyes: EOM are normal. Pupils are equal, round, and reactive to light.  Neck: Normal range of motion. Neck supple.  No meningismus  Cardiovascular: Normal rate and regular rhythm.   Pulmonary/Chest: Effort normal and breath sounds normal. No respiratory distress. She has no wheezes. She has no rales. She exhibits no tenderness.  Abdominal: Soft. Bowel sounds are normal. She exhibits no  distension and no mass. There is no tenderness. There is no rebound and no guarding.  Musculoskeletal: Normal range of motion. She exhibits no edema and no tenderness.  Neurological: She is alert and oriented to person, place, and time.  5/5 motor no extremities. Sensation is intact. Mild ataxia with finger to nose on the left. Questionable medicine we will flattening on the left.   Skin: Skin is warm and dry. No rash noted. No erythema.  Psychiatric: She has a normal mood and affect. Her behavior is normal.    ED Course  Procedures (including critical care time) Labs Review Labs Reviewed  CBC WITH DIFFERENTIAL  PROTIME-INR  COMPREHENSIVE METABOLIC PANEL  APTT    Imaging Review Ct Head Wo Contrast  09/09/2013   CLINICAL DATA:  80 year old female with orthostatics hypotension. Memory loss and confusion. Initial encounter. No known malignancy.  EXAM: CT HEAD WITHOUT CONTRAST  TECHNIQUE: Contiguous axial images were obtained from the base of the skull through the vertex without intravenous contrast.  COMPARISON:  [facility] head CT 08/07/2011, and earlier.  FINDINGS: Visualized paranasal sinuses and mastoids are clear. Calcified atherosclerosis at the skull base. Postoperative changes to the globes. Negative scalp soft tissues. No acute or suspicious osseous lesion identified.  New abnormal hypodensity, widespread in the right superior frontal lobe is in a vasogenic edema pattern and appears related to a 25 mm soft tissue mass located within the medial right parietal lobe versus along the right interhemispheric fissure. There is also abnormal vasogenic edema pattern hypodensity in the left superior frontal gyrus. There is a small focus of edema in the left frontal operculum (image 17). There is possibly also a small focus of edema in the left occipital pole (image 16). There is also abnormal hypodensity which is more amorphous in the central superior cerebellum, tracking to the left. Subtle  evidence of a left cerebellar mass which may measure up to 2 cm on image 8.  Despite these findings, no impending herniation or loss of the basilar cisterns. There is mild leftward midline shift of 4-5 mm. No ventriculomegaly. No acute intracranial hemorrhage identified. No superimposed acute cortically based infarct identified. No suspicious intracranial vascular hyperdensity.  IMPRESSION: 1. New multifocal cerebral edema suggesting multiple Brain masses, at least 5 lesions suspected on the basis of this study. Favor metastatic disease to the brain. Followup brain MRI (without and with contrast) or alternatively post-contrast head CT would characterize further. 2. No impending herniation. No ventriculomegaly. There is mild leftward midline shift. Critical Value/emergent results were called by telephone at the time of interpretation on 09/09/2013 at 4:40 pm to Dr. Prince Solian , who verbally acknowledged these results.   Electronically Signed   By: Lars Pinks M.D.   On: 09/09/2013 16:45   Mr Jeri Cos YK Contrast  09/10/2013   CLINICAL  DATA:  Confusion.  Abnormal head CT  EXAM: MRI HEAD WITHOUT AND WITH CONTRAST  TECHNIQUE: Multiplanar, multiecho pulse sequences of the brain and surrounding structures were obtained without and with intravenous contrast.  CONTRAST:  55mL MULTIHANCE GADOBENATE DIMEGLUMINE 529 MG/ML IV SOLN  COMPARISON:  CT head 09/09/2013  FINDINGS: Multiple enhancing mass lesions are present consistent with extensive metastatic disease to the brain. The largest lesion is in the right occipital lobe and measures 27 x 24 mm and contains a small amount of hemorrhage. There is considerable edema in the right parietal lobe related to this lesion. 14 x 18 mm lesion in the superior cerebellar vermis. 13 x 17 mm lesion in the left inferior cerebellum. There is moderate edema in the left cerebellum. 13 mm lesion in the left thalamus.  Approximately 20 metastatic deposits are present in the brain.  3 mm  midline shift to the left. No acute infarct. Ventricles are not enlarged.  IMPRESSION: Widespread metastatic disease to the brain. Approximately 20 lesions are present. The largest lesion in the right occipital lobe contains a mild amount of hemorrhage. There is a large amount of edema in the right parietal white matter and a large amount of edema in the left cerebellum  Mild midline shift to the left of approximately 3 mm.  These results were called by telephone at the time of interpretation on 09/10/2013 at 8:51 pm to Dr. Joylene Draft , who verbally acknowledged these results.   Electronically Signed   By: Franchot Gallo M.D.   On: 09/10/2013 20:52   US Carotid Bilateral  09/09/2013   CLINICAL DATA:  CAD, syncopal episode, possible TIA. History of hyperlipidemia and smoking. Carotid bruit. Possible history of vascular stents within the bilateral lower extremity arteries.  EXAM: BILATERAL CAROTID DUPLEX ULTRASOUND  TECHNIQUE: Pearline Cables scale imaging, color Doppler and duplex ultrasound were performed of bilateral carotid and vertebral arteries in the neck.  COMPARISON:  Head CT -08/07/2011  FINDINGS: Criteria: Quantification of carotid stenosis is based on velocity parameters that correlate the residual internal carotid diameter with NASCET-based stenosis levels, using the diameter of the distal internal carotid lumen as the denominator for stenosis measurement.  The following velocity measurements were obtained:  RIGHT  ICA:  95/16 cm/sec  CCA:  71/2 cm/sec  SYSTOLIC ICA/CCA RATIO:  4.58  DIASTOLIC ICA/CCA RATIO:  0.99  ECA:  246 cm/sec  LEFT  ICA:  155/22 cm/sec  CCA:  833/8 cm/sec  SYSTOLIC ICA/CCA RATIO:  2.50  DIASTOLIC ICA/CCA RATIO:  5.39  ECA:  97 cm/sec  RIGHT CAROTID ARTERY: There is a moderate amount of scattered E centric echogenic foci of partially shadowing atherosclerotic plaque throughout the right common carotid artery (representative images or and 5). There is a moderate amount of eccentric mixed echogenic  partially shadowing plaque within the right carotid bulb (images 6 and 7, extending to involve the origin and proximal aspect of the right internal carotid artery (image 8), not resulting in elevated peak systolic velocities in the interrogated course of the right internal carotid artery to suggest a hemodynamically significant stenosis.  RIGHT VERTEBRAL ARTERY:  Antegrade flow  LEFT CAROTID ARTERY: There is a moderate amount of eccentric echogenic partially shadowing plaque within the mid aspect of the left common carotid artery (images 37 and 49). There is a moderate to large amount of eccentric mixed echogenic partially shadowing plaque within the left carotid bulb (images 39 and 52), extending to involve the origin and proximal aspects of the left internal  carotid artery (images 41, 42 and 43), resulting in elevated peak systolic velocities within the proximal aspect of the left internal carotid artery (greatest peak systolic velocity measuring 155 cm/sec - image 66).  LEFT VERTEBRAL ARTERY:  Antegrade flow  IMPRESSION: 1. Moderate to large amount of left-sided atherosclerotic plaque results in elevated peak systolic velocities within the left internal carotid artery compatible with the 50-69% luminal narrowing range. Further evaluation with CTA could be performed as clinically indicated. 2. Moderate amount of right-sided atherosclerotic plaque, not definitely resulting in a hemodynamically significant stenosis.   Electronically Signed   By: Sandi Mariscal M.D.   On: 09/09/2013 15:53     EKG Interpretation None      MDM   Final diagnoses:  None   Discussed Hirsch. Recommend 6 Decadron 2 mg every 6 hours. Will evaluate in the morning.  [facility] will admit patient.  Julianne Rice, MD 09/11/13 [phone]

## 2013-09-10 NOTE — ED Notes (Signed)
Pt sent by on call radiologist from MRI due to pt being found to "have brain tumors and needing steroids and to get the swelling down" per pt's daughter. Pt had CT yesterday due to multiple recent falls. Sent for MRI today after CT showed lesions or masses.

## 2013-09-11 ENCOUNTER — Inpatient Hospital Stay (HOSPITAL_COMMUNITY): Payer: PRIVATE HEALTH INSURANCE

## 2013-09-11 ENCOUNTER — Other Ambulatory Visit (HOSPITAL_COMMUNITY): Payer: Medicare Other

## 2013-09-11 ENCOUNTER — Encounter (HOSPITAL_COMMUNITY): Payer: Self-pay | Admitting: Internal Medicine

## 2013-09-11 DIAGNOSIS — G939 Disorder of brain, unspecified: Secondary | ICD-10-CM

## 2013-09-11 DIAGNOSIS — Z9181 History of falling: Secondary | ICD-10-CM | POA: Diagnosis not present

## 2013-09-11 DIAGNOSIS — C7949 Secondary malignant neoplasm of other parts of nervous system: Secondary | ICD-10-CM | POA: Diagnosis not present

## 2013-09-11 DIAGNOSIS — I10 Essential (primary) hypertension: Secondary | ICD-10-CM | POA: Diagnosis present

## 2013-09-11 DIAGNOSIS — Z515 Encounter for palliative care: Secondary | ICD-10-CM | POA: Diagnosis not present

## 2013-09-11 DIAGNOSIS — I629 Nontraumatic intracranial hemorrhage, unspecified: Secondary | ICD-10-CM

## 2013-09-11 DIAGNOSIS — I2510 Atherosclerotic heart disease of native coronary artery without angina pectoris: Secondary | ICD-10-CM | POA: Diagnosis present

## 2013-09-11 DIAGNOSIS — C801 Malignant (primary) neoplasm, unspecified: Secondary | ICD-10-CM

## 2013-09-11 DIAGNOSIS — E785 Hyperlipidemia, unspecified: Secondary | ICD-10-CM | POA: Diagnosis present

## 2013-09-11 DIAGNOSIS — R634 Abnormal weight loss: Secondary | ICD-10-CM | POA: Diagnosis present

## 2013-09-11 DIAGNOSIS — G9389 Other specified disorders of brain: Secondary | ICD-10-CM | POA: Diagnosis present

## 2013-09-11 DIAGNOSIS — Z7982 Long term (current) use of aspirin: Secondary | ICD-10-CM | POA: Diagnosis not present

## 2013-09-11 DIAGNOSIS — F17200 Nicotine dependence, unspecified, uncomplicated: Secondary | ICD-10-CM | POA: Diagnosis present

## 2013-09-11 DIAGNOSIS — C7931 Secondary malignant neoplasm of brain: Secondary | ICD-10-CM | POA: Diagnosis present

## 2013-09-11 DIAGNOSIS — Z23 Encounter for immunization: Secondary | ICD-10-CM | POA: Diagnosis not present

## 2013-09-11 DIAGNOSIS — I739 Peripheral vascular disease, unspecified: Secondary | ICD-10-CM | POA: Diagnosis present

## 2013-09-11 DIAGNOSIS — I6529 Occlusion and stenosis of unspecified carotid artery: Secondary | ICD-10-CM | POA: Diagnosis present

## 2013-09-11 DIAGNOSIS — Z808 Family history of malignant neoplasm of other organs or systems: Secondary | ICD-10-CM | POA: Diagnosis not present

## 2013-09-11 DIAGNOSIS — Z66 Do not resuscitate: Secondary | ICD-10-CM | POA: Diagnosis present

## 2013-09-11 LAB — COMPREHENSIVE METABOLIC PANEL
ALK PHOS: 78 U/L (ref 39–117)
ALT: 12 U/L (ref 0–35)
ALT: 12 U/L (ref 0–35)
ANION GAP: 13 (ref 5–15)
AST: 17 U/L (ref 0–37)
AST: 19 U/L (ref 0–37)
Albumin: 3.2 g/dL — ABNORMAL LOW (ref 3.5–5.2)
Albumin: 3.5 g/dL (ref 3.5–5.2)
Alkaline Phosphatase: 81 U/L (ref 39–117)
Anion gap: 13 (ref 5–15)
BILIRUBIN TOTAL: 0.4 mg/dL (ref 0.3–1.2)
BILIRUBIN TOTAL: 0.4 mg/dL (ref 0.3–1.2)
BUN: 4 mg/dL — AB (ref 6–23)
BUN: 4 mg/dL — ABNORMAL LOW (ref 6–23)
CALCIUM: 8.9 mg/dL (ref 8.4–10.5)
CO2: 23 mEq/L (ref 19–32)
CO2: 24 mEq/L (ref 19–32)
Calcium: 9.5 mg/dL (ref 8.4–10.5)
Chloride: 90 mEq/L — ABNORMAL LOW (ref 96–112)
Chloride: 92 mEq/L — ABNORMAL LOW (ref 96–112)
Creatinine, Ser: 0.61 mg/dL (ref 0.50–1.10)
Creatinine, Ser: 0.63 mg/dL (ref 0.50–1.10)
GFR calc non Af Amer: 81 mL/min — ABNORMAL LOW (ref >90)
GFR calc non Af Amer: 82 mL/min — ABNORMAL LOW (ref >90)
GLUCOSE: 115 mg/dL — AB (ref 70–99)
GLUCOSE: 142 mg/dL — AB (ref 70–99)
POTASSIUM: 3.9 meq/L (ref 3.7–5.3)
Potassium: 4.1 mEq/L (ref 3.7–5.3)
Sodium: 127 mEq/L — ABNORMAL LOW (ref 137–147)
Sodium: 128 mEq/L — ABNORMAL LOW (ref 137–147)
TOTAL PROTEIN: 6.5 g/dL (ref 6.0–8.3)
Total Protein: 6.1 g/dL (ref 6.0–8.3)

## 2013-09-11 LAB — CBC WITH DIFFERENTIAL/PLATELET
BASOS ABS: 0.1 10*3/uL (ref 0.0–0.1)
Basophils Relative: 1 % (ref 0–1)
EOS ABS: 0.9 10*3/uL — AB (ref 0.0–0.7)
EOS PCT: 9 % — AB (ref 0–5)
HEMATOCRIT: 34.7 % — AB (ref 36.0–46.0)
Hemoglobin: 12.4 g/dL (ref 12.0–15.0)
Lymphocytes Relative: 40 % (ref 12–46)
Lymphs Abs: 3.9 10*3/uL (ref 0.7–4.0)
MCH: 30.9 pg (ref 26.0–34.0)
MCHC: 35.7 g/dL (ref 30.0–36.0)
MCV: 86.5 fL (ref 78.0–100.0)
Monocytes Absolute: 0.9 10*3/uL (ref 0.1–1.0)
Monocytes Relative: 10 % (ref 3–12)
Neutro Abs: 3.7 10*3/uL (ref 1.7–7.7)
Neutrophils Relative %: 40 % — ABNORMAL LOW (ref 43–77)
PLATELETS: 296 10*3/uL (ref 150–400)
RBC: 4.01 MIL/uL (ref 3.87–5.11)
RDW: 12.2 % (ref 11.5–15.5)
WBC: 9.5 10*3/uL (ref 4.0–10.5)

## 2013-09-11 LAB — PROTIME-INR
INR: 0.93 (ref 0.00–1.49)
PROTHROMBIN TIME: 12.5 s (ref 11.6–15.2)

## 2013-09-11 LAB — CEA: CEA: 52.8 ng/mL — ABNORMAL HIGH (ref 0.0–5.0)

## 2013-09-11 LAB — CBC
HCT: 33.3 % — ABNORMAL LOW (ref 36.0–46.0)
Hemoglobin: 11.8 g/dL — ABNORMAL LOW (ref 12.0–15.0)
MCH: 30.7 pg (ref 26.0–34.0)
MCHC: 35.4 g/dL (ref 30.0–36.0)
MCV: 86.7 fL (ref 78.0–100.0)
PLATELETS: 279 10*3/uL (ref 150–400)
RBC: 3.84 MIL/uL — ABNORMAL LOW (ref 3.87–5.11)
RDW: 12.2 % (ref 11.5–15.5)
WBC: 6.8 10*3/uL (ref 4.0–10.5)

## 2013-09-11 LAB — MAGNESIUM: Magnesium: 1.7 mg/dL (ref 1.5–2.5)

## 2013-09-11 LAB — PHOSPHORUS: Phosphorus: 3.2 mg/dL (ref 2.3–4.6)

## 2013-09-11 LAB — MRSA PCR SCREENING: MRSA by PCR: NEGATIVE

## 2013-09-11 LAB — TSH: TSH: 1.58 u[IU]/mL (ref 0.350–4.500)

## 2013-09-11 LAB — APTT: aPTT: 31 seconds (ref 24–37)

## 2013-09-11 MED ORDER — ONDANSETRON HCL 4 MG/2ML IJ SOLN: 4.0000 mg | Freq: Four times a day (QID) | INTRAMUSCULAR | Status: DC | PRN

## 2013-09-11 MED ORDER — DEXAMETHASONE SODIUM PHOSPHATE 10 MG/ML IJ SOLN
10.0000 mg | Freq: Four times a day (QID) | INTRAMUSCULAR | Status: DC
  Administered 2013-09-11 – 2013-09-13 (×10): 10 mg via INTRAVENOUS
  Filled 2013-09-11 (×10): qty 1

## 2013-09-11 MED ORDER — NICOTINE 7 MG/24HR TD PT24
7.0000 mg | MEDICATED_PATCH | Freq: Every day | TRANSDERMAL | Status: DC
  Administered 2013-09-11: 7 mg via TRANSDERMAL
  Filled 2013-09-11 (×2): qty 1

## 2013-09-11 MED ORDER — PNEUMOCOCCAL VAC POLYVALENT 25 MCG/0.5ML IJ INJ
0.5000 mL | INJECTION | INTRAMUSCULAR | Status: AC
  Administered 2013-09-12: 0.5 mL via INTRAMUSCULAR
  Filled 2013-09-11 (×3): qty 0.5

## 2013-09-11 MED ORDER — SODIUM CHLORIDE 0.9 % IV SOLN
INTRAVENOUS | Status: AC
  Administered 2013-09-11: 02:00:00 via INTRAVENOUS

## 2013-09-11 MED ORDER — ACETAMINOPHEN 650 MG RE SUPP: 650.0000 mg | Freq: Four times a day (QID) | RECTAL | Status: DC | PRN

## 2013-09-11 MED ORDER — ALBUTEROL SULFATE (2.5 MG/3ML) 0.083% IN NEBU: 2.5000 mg | INHALATION_SOLUTION | RESPIRATORY_TRACT | Status: DC | PRN

## 2013-09-11 MED ORDER — ACETAMINOPHEN 325 MG PO TABS: 650.0000 mg | ORAL_TABLET | Freq: Four times a day (QID) | ORAL | Status: DC | PRN

## 2013-09-11 MED ORDER — METOPROLOL SUCCINATE ER 25 MG PO TB24
25.0000 mg | ORAL_TABLET | Freq: Every day | ORAL | Status: DC
  Administered 2013-09-11 – 2013-09-13 (×3): 25 mg via ORAL
  Filled 2013-09-11 (×3): qty 1

## 2013-09-11 MED ORDER — DOCUSATE SODIUM 100 MG PO CAPS
100.0000 mg | ORAL_CAPSULE | Freq: Two times a day (BID) | ORAL | Status: DC
  Administered 2013-09-11 – 2013-09-13 (×5): 100 mg via ORAL
  Filled 2013-09-11 (×6): qty 1

## 2013-09-11 MED ORDER — SODIUM CHLORIDE 0.9 % IJ SOLN
3.0000 mL | Freq: Two times a day (BID) | INTRAMUSCULAR | Status: DC
  Administered 2013-09-11 – 2013-09-13 (×5): 3 mL via INTRAVENOUS

## 2013-09-11 MED ORDER — GUAIFENESIN ER 600 MG PO TB12
600.0000 mg | ORAL_TABLET | Freq: Two times a day (BID) | ORAL | Status: DC
  Administered 2013-09-11 – 2013-09-13 (×4): 600 mg via ORAL
  Filled 2013-09-11 (×6): qty 1

## 2013-09-11 MED ORDER — HYDROCODONE-ACETAMINOPHEN 5-325 MG PO TABS
1.0000 | ORAL_TABLET | ORAL | Status: DC | PRN
Start: 2013-09-11 — End: 2013-09-13

## 2013-09-11 MED ORDER — ONDANSETRON HCL 4 MG PO TABS: 4.0000 mg | ORAL_TABLET | Freq: Four times a day (QID) | ORAL | Status: DC | PRN

## 2013-09-11 NOTE — H&P (Signed)
PCP:  Tivis Ringer, MD  Neurosurgery [facility] have seen patient in the past for subdural hematomas  Chief Complaint:  Frequent falls  HPI: Holly Taylor is a 80 y.o. female   has a past medical history of Coronary artery disease; Hyperlipidemia; Hypertension; Peripheral vascular disease; Cerebrovascular disease; Carotid artery disease; and Shortness of breath.   Presented with  2 month history of falls. Last week she was seen by her PCP and CT of the head was ordered. CT scan showed extensive metastatic dz. of the brain of unclear etiology. MRI was done showing 20 masses likely due to metastasis The largest lesion in the right occipital lobe contains a mild amount of hemorrhage.  Of note patient is on Aspirin for PVD.  Over the last 3 months her ability to ambulate have diminished. She has lost some weight. PAtiet never had mamagrams or colonoscopy. Continues to be a heavy smoker despite PVD diagnosis.   Hospitalist was called for admission for brain masses  Review of Systems:    Pertinent positives include:  Frequent falls. weight loss changed from size 14 to 8 over past year. no shortness of breath at rest.   Constitutional:  No weight loss, night sweats, Fevers, chills, fatigue    HEENT:  No headaches, Difficulty swallowing,Tooth/dental problems,Sore throat,  No sneezing, itching, ear ache, nasal congestion, post nasal drip,  Cardio-vascular:  No chest pain, Orthopnea, PND, anasarca, dizziness, palpitations.no Bilateral lower extremity swelling  GI:  No heartburn, indigestion, abdominal pain, nausea, vomiting, diarrhea, change in bowel habits, loss of appetite, melena, blood in stool, hematemesis Resp:  No dyspnea on exertion, No excess mucus, no productive cough, No non-productive cough, No coughing up of blood.No change in color of mucus.No wheezing. Skin:  no rash or lesions. No jaundice GU:  no dysuria, change in color of urine, no urgency or frequency. No straining to  urinate.  No flank pain.  Musculoskeletal:  No joint pain or no joint swelling. No decreased range of motion. No back pain.  Psych:  No change in mood or affect. No depression or anxiety. No memory loss.  Neuro: no localizing neurological complaints, no tingling, no weakness, no double vision, no gait abnormality, no slurred speech, no confusion  Otherwise ROS are negative except for above, 10 systems were reviewed  Past Medical History: Past Medical History  Diagnosis Date  . Coronary artery disease   . Hyperlipidemia   . Hypertension   . Peripheral vascular disease   . Cerebrovascular disease   . Carotid artery disease   . Shortness of breath    Past Surgical History  Procedure Laterality Date  . Femoral pseudoaneurysm      repair of right   . Pseudoaneurysm repair    . Aortogram    . Abdomina aorto    . Endarterectomy       Medications: Prior to Admission medications   Medication Sig Start Date End Date Taking? Authorizing Provider  amLODipine (NORVASC) 5 MG tablet Take 5 mg by mouth daily.     Yes Historical Provider, MD  aspirin 325 MG tablet Take 325 mg by mouth daily.   Yes Historical Provider, MD  LORazepam (ATIVAN) 1 MG tablet Take 0.5 tablets by mouth daily as needed (to be taken before CT Scan). anxiety 09/10/13  Yes Historical Provider, MD  metoprolol succinate (TOPROL-XL) 25 MG 24 hr tablet Take 1 tablet (25 mg total) by mouth daily. 12/15/12  Yes Dorothy Spark, MD  Multiple Vitamin (MULTIVITAMIN)  tablet Take 1 tablet by mouth daily.     Yes Historical Provider, MD  omega-3 acid ethyl esters (LOVAZA) 1 G capsule Take 1 g by mouth daily.   Yes Historical Provider, MD  simvastatin (ZOCOR) 20 MG tablet Take 20 mg by mouth at bedtime.     Yes Historical Provider, MD  vitamin B-12 (CYANOCOBALAMIN) 1000 MCG tablet Take 1,000 mcg by mouth daily.   Yes Historical Provider, MD  cilostazol (PLETAL) 100 MG tablet Take 1 tablet (100 mg total) by mouth 2 (two) times  daily. 12/15/12   Dorothy Spark, MD    Allergies:  No Known Allergies  Social History:  Ambulatory  independently   Lives at home alone    reports that she has been smoking Cigarettes.  She has been smoking about 0.50 packs per day. She does not have any smokeless tobacco history on file. She reports that she does not drink alcohol or use illicit drugs.    Family History: family history includes Cancer in her brother; Coronary artery disease in an other family member; Parkinson's disease in her mother; Seizures in her brother; Skin cancer in her brother; Stroke in her father and sister.    Physical Exam: Patient Vitals for the past 24 hrs:  BP Temp Temp src Pulse Resp SpO2  09/10/13 2315 120/90 mmHg 98.8 F (37.1 C) Oral 80 22 99 %    1. General:  in No Acute distress 2. Psychological: Alert and  Oriented but hard of hearing 3. Head/ENT:   Moist   Mucous Membranes                          Head Non traumatic, neck supple                          Normal  Dentition 4. SKIN: normal  Skin turgor,  Skin clean Dry and intact no rash 5. Heart: Regular rate and rhythm no Murmur, Rub or gallop 6. Lungs:  no wheezes but extensive crackles  Through out 7. Abdomen: Soft, non-tender, Non distended 8. Lower extremities: no clubbing, cyanosis, or edema 9. Neurologically strength 5/5 in all 4 ext. CN 2-12 intact  10. MSK: Normal range of motion Breast exam some fibrous tissue in Right breast more than left but no overt evidence of malignancy.   body mass index is unknown because there is no weight on file.   Labs on Admission:   Recent Labs  09/10/13 2347  NA 127*  K 4.1  CL 90*  CO2 24  GLUCOSE 115*  BUN 4*  CREATININE 0.63  CALCIUM 9.5    Recent Labs  09/10/13 2347  AST 19  ALT 12  ALKPHOS 81  BILITOT 0.4  PROT 6.5  ALBUMIN 3.5   No results found for this basename: LIPASE, AMYLASE,  in the last 72 hours  Recent Labs  09/10/13 2347  WBC 9.5  NEUTROABS 3.7   HGB 12.4  HCT 34.7*  MCV 86.5  PLT 296   No results found for this basename: CKTOTAL, CKMB, CKMBINDEX, TROPONINI,  in the last 72 hours No results found for this basename: TSH, T4TOTAL, FREET3, T3FREE, THYROIDAB,  in the last 72 hours No results found for this basename: VITAMINB12, FOLATE, FERRITIN, TIBC, IRON, RETICCTPCT,  in the last 72 hours No results found for this basename: HGBA1C    The CrCl is unknown because both a height and  weight (above a minimum accepted value) are required for this calculation. ABG No results found for this basename: phart, pco2, po2, hco3, tco2, acidbasedef, o2sat     No results found for this basename: DDIMER     BNP (last 3 results) No results found for this basename: PROBNP,  in the last 8760 hours  There were no vitals filed for this visit.   Cultures: No results found for this basename: sdes, specrequest, cult, reptstatus    Radiological Exams on Admission: Ct Head Wo Contrast  09/09/2013   CLINICAL DATA:  80 year old female with orthostatics hypotension. Memory loss and confusion. Initial encounter. No known malignancy.  EXAM: CT HEAD WITHOUT CONTRAST  TECHNIQUE: Contiguous axial images were obtained from the base of the skull through the vertex without intravenous contrast.  COMPARISON:  [facility] head CT 08/07/2011, and earlier.  FINDINGS: Visualized paranasal sinuses and mastoids are clear. Calcified atherosclerosis at the skull base. Postoperative changes to the globes. Negative scalp soft tissues. No acute or suspicious osseous lesion identified.  New abnormal hypodensity, widespread in the right superior frontal lobe is in a vasogenic edema pattern and appears related to a 25 mm soft tissue mass located within the medial right parietal lobe versus along the right interhemispheric fissure. There is also abnormal vasogenic edema pattern hypodensity in the left superior frontal gyrus. There is a small focus of edema in the left frontal  operculum (image 17). There is possibly also a small focus of edema in the left occipital pole (image 16). There is also abnormal hypodensity which is more amorphous in the central superior cerebellum, tracking to the left. Subtle evidence of a left cerebellar mass which may measure up to 2 cm on image 8.  Despite these findings, no impending herniation or loss of the basilar cisterns. There is mild leftward midline shift of 4-5 mm. No ventriculomegaly. No acute intracranial hemorrhage identified. No superimposed acute cortically based infarct identified. No suspicious intracranial vascular hyperdensity.  IMPRESSION: 1. New multifocal cerebral edema suggesting multiple Brain masses, at least 5 lesions suspected on the basis of this study. Favor metastatic disease to the brain. Followup brain MRI (without and with contrast) or alternatively post-contrast head CT would characterize further. 2. No impending herniation. No ventriculomegaly. There is mild leftward midline shift. Critical Value/emergent results were called by telephone at the time of interpretation on 09/09/2013 at 4:40 pm to Dr. Prince Solian , who verbally acknowledged these results.   Electronically Signed   By: Lars Pinks M.D.   On: 09/09/2013 16:45   Mr Jeri Cos RX Contrast  09/10/2013   CLINICAL DATA:  Confusion.  Abnormal head CT  EXAM: MRI HEAD WITHOUT AND WITH CONTRAST  TECHNIQUE: Multiplanar, multiecho pulse sequences of the brain and surrounding structures were obtained without and with intravenous contrast.  CONTRAST:  56mL MULTIHANCE GADOBENATE DIMEGLUMINE 529 MG/ML IV SOLN  COMPARISON:  CT head 09/09/2013  FINDINGS: Multiple enhancing mass lesions are present consistent with extensive metastatic disease to the brain. The largest lesion is in the right occipital lobe and measures 27 x 24 mm and contains a small amount of hemorrhage. There is considerable edema in the right parietal lobe related to this lesion. 14 x 18 mm lesion in the  superior cerebellar vermis. 13 x 17 mm lesion in the left inferior cerebellum. There is moderate edema in the left cerebellum. 13 mm lesion in the left thalamus.  Approximately 20 metastatic deposits are present in the brain.  3 mm  midline shift to the left. No acute infarct. Ventricles are not enlarged.  IMPRESSION: Widespread metastatic disease to the brain. Approximately 20 lesions are present. The largest lesion in the right occipital lobe contains a mild amount of hemorrhage. There is a large amount of edema in the right parietal white matter and a large amount of edema in the left cerebellum  Mild midline shift to the left of approximately 3 mm.  These results were called by telephone at the time of interpretation on 09/10/2013 at 8:51 pm to Dr. Joylene Draft , who verbally acknowledged these results.   Electronically Signed   By: Franchot Gallo M.D.   On: 09/10/2013 20:52   US Carotid Bilateral  09/09/2013   CLINICAL DATA:  CAD, syncopal episode, possible TIA. History of hyperlipidemia and smoking. Carotid bruit. Possible history of vascular stents within the bilateral lower extremity arteries.  EXAM: BILATERAL CAROTID DUPLEX ULTRASOUND  TECHNIQUE: Pearline Cables scale imaging, color Doppler and duplex ultrasound were performed of bilateral carotid and vertebral arteries in the neck.  COMPARISON:  Head CT -08/07/2011  FINDINGS: Criteria: Quantification of carotid stenosis is based on velocity parameters that correlate the residual internal carotid diameter with NASCET-based stenosis levels, using the diameter of the distal internal carotid lumen as the denominator for stenosis measurement.  The following velocity measurements were obtained:  RIGHT  ICA:  95/16 cm/sec  CCA:  81/1 cm/sec  SYSTOLIC ICA/CCA RATIO:  9.14  DIASTOLIC ICA/CCA RATIO:  7.82  ECA:  246 cm/sec  LEFT  ICA:  155/22 cm/sec  CCA:  956/2 cm/sec  SYSTOLIC ICA/CCA RATIO:  1.30  DIASTOLIC ICA/CCA RATIO:  8.65  ECA:  97 cm/sec  RIGHT CAROTID ARTERY: There is a  moderate amount of scattered E centric echogenic foci of partially shadowing atherosclerotic plaque throughout the right common carotid artery (representative images or and 5). There is a moderate amount of eccentric mixed echogenic partially shadowing plaque within the right carotid bulb (images 6 and 7, extending to involve the origin and proximal aspect of the right internal carotid artery (image 8), not resulting in elevated peak systolic velocities in the interrogated course of the right internal carotid artery to suggest a hemodynamically significant stenosis.  RIGHT VERTEBRAL ARTERY:  Antegrade flow  LEFT CAROTID ARTERY: There is a moderate amount of eccentric echogenic partially shadowing plaque within the mid aspect of the left common carotid artery (images 37 and 49). There is a moderate to large amount of eccentric mixed echogenic partially shadowing plaque within the left carotid bulb (images 39 and 52), extending to involve the origin and proximal aspects of the left internal carotid artery (images 41, 42 and 43), resulting in elevated peak systolic velocities within the proximal aspect of the left internal carotid artery (greatest peak systolic velocity measuring 155 cm/sec - image 66).  LEFT VERTEBRAL ARTERY:  Antegrade flow  IMPRESSION: 1. Moderate to large amount of left-sided atherosclerotic plaque results in elevated peak systolic velocities within the left internal carotid artery compatible with the 50-69% luminal narrowing range. Further evaluation with CTA could be performed as clinically indicated. 2. Moderate amount of right-sided atherosclerotic plaque, not definitely resulting in a hemodynamically significant stenosis.   Electronically Signed   By: Sandi Mariscal M.D.   On: 09/09/2013 15:53    Chart has been reviewed  Assessment/Plan  80 yo F with Hx of tobacco abuse here with frequent falls and numerous metastatic lesion in the brain with small evidence of hemorrhage.   Present on  Admission:  .  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 Kendal Hymen who is aware and will consult in AM. Adit to stepdown due to small intracranial bleeding with neuro checks. IV decadron. Most likely sourse is Lung . TOBACCO USER - spoke about dangers of smoking . HYPERTENSION - cont metoprolol and monitor Hx of PVD - give small amount of bleeding will hold aspirin and pletal   Prophylaxis: SCD , Protonix  CODE STATUS:  DNR/DNI per patient wishes, over all likely poor prognosis but family and patietn would like to pursue further diagnostics to find the primary source.   Other plan as per orders.  I have spent a total of 55 min on this admission  Gauge Winski 09/11/2013, 1:11 AM  [facility]  Pager [phone]   If 7AM-7PM, please contact the day team taking care of the patient  [email or web address]  Password [email or web address]

## 2013-09-11 NOTE — Progress Notes (Signed)
Patient seen and evaluated earlier this am by my associate. Please refer to the H and P for details regarding assessment and plan.  Will reassess next am.  Per EMR Neurosurgeon to evaluate today.  Hurbert Duran, [employer]

## 2013-09-12 ENCOUNTER — Inpatient Hospital Stay (HOSPITAL_COMMUNITY): Payer: PRIVATE HEALTH INSURANCE

## 2013-09-12 DIAGNOSIS — C7949 Secondary malignant neoplasm of other parts of nervous system: Secondary | ICD-10-CM | POA: Diagnosis not present

## 2013-09-12 DIAGNOSIS — C7931 Secondary malignant neoplasm of brain: Secondary | ICD-10-CM | POA: Diagnosis not present

## 2013-09-12 MED ORDER — HYDROXYZINE HCL 25 MG PO TABS
25.0000 mg | ORAL_TABLET | Freq: Three times a day (TID) | ORAL | Status: DC | PRN
  Administered 2013-09-12 – 2013-09-13 (×3): 25 mg via ORAL
  Filled 2013-09-12 (×4): qty 1

## 2013-09-12 MED ORDER — NICOTINE 21 MG/24HR TD PT24
21.0000 mg | MEDICATED_PATCH | Freq: Every day | TRANSDERMAL | Status: DC
  Administered 2013-09-12 – 2013-09-13 (×2): 21 mg via TRANSDERMAL
  Filled 2013-09-12 (×2): qty 1

## 2013-09-12 MED ORDER — IOHEXOL 300 MG/ML  SOLN
50.0000 mL | Freq: Once | INTRAMUSCULAR | Status: AC | PRN
  Administered 2013-09-12: 50 mL via ORAL

## 2013-09-12 MED ORDER — ENSURE COMPLETE PO LIQD
237.0000 mL | Freq: Two times a day (BID) | ORAL | Status: DC
  Administered 2013-09-12 – 2013-09-13 (×3): 237 mL via ORAL

## 2013-09-12 MED ORDER — HYDROXYZINE HCL 10 MG PO TABS
10.0000 mg | ORAL_TABLET | Freq: Three times a day (TID) | ORAL | Status: DC | PRN
  Administered 2013-09-12: 10 mg via ORAL
  Filled 2013-09-12: qty 1

## 2013-09-12 MED ORDER — IOHEXOL 300 MG/ML  SOLN
100.0000 mL | Freq: Once | INTRAMUSCULAR | Status: AC | PRN
Start: 2013-09-12 — End: 2013-09-12
  Administered 2013-09-12: 100 mL via INTRAVENOUS

## 2013-09-12 MED ORDER — NICOTINE 21 MG/24HR TD PT24: 21.0000 mg | MEDICATED_PATCH | Freq: Every day | TRANSDERMAL | Status: DC

## 2013-09-12 NOTE — Progress Notes (Addendum)
PT Cancellation Note  Patient Details Name: Holly Taylor MRN: [id number] DOB: 09/19/1928   Cancelled Treatment:    Reason Eval/Treat Not Completed: Patient not medically ready (Per RN, new diagnosis of brain hemorrhage and  brain lesions. Further work up  For cancer in progress. Palliative care consult being considered. Need further clearance for activity.)will check back 09/13/13    Marcelino Freestone PT [phone]  09/12/2013, 1:11 PM

## 2013-09-12 NOTE — Plan of Care (Signed)
Problem: Phase I Progression Outcomes Goal: OOB as tolerated unless otherwise ordered Outcome: Not Progressing Brain hemorrhage, unstable.

## 2013-09-12 NOTE — Progress Notes (Signed)
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  It is recommended that we image the rest of her body to try and find primary source or another site to biopsy. - Place order for CT scan of the head, chest, abdomen, and pelvis - Continue Decadron  Active Problems:   TOBACCO USER - Increased nicotine patch to appropriate dose given history of 1ppd smoking history    HYPERTENSION - Metoprolol on board and BP relatively well controlled  Code Status: DNR Family Communication: discussed with daughter at bedside. Disposition Plan: For now obtain further imaging study then bx and palliative care consult.    Consultants:  Neurosurgery  Procedures:  None  Antibiotics:  None  HPI/Subjective: Pt has no new complaints. No acute issues reported overnight.  Objective: Filed Vitals:   09/12/13 0816  BP: 122/64  Pulse: 77  Temp:   Resp: 23    Intake/Output Summary (Last 24 hours) at 09/12/13 1057 Last data filed at 09/11/13 2328  Gross per 24 hour  Intake    375 ml  Output   1350 ml  Net   -975 ml   Filed Weights   09/11/13 0159 09/12/13 0535  Weight: 63.8 kg (140 lb 10.5 oz) 63.6 kg (140 lb 3.4 oz)    Exam:   General:  Pt in nad, alert and awake  Cardiovascular: rrr, no mrg  Respiratory: cta bl, no wheezes  Abdomen: soft, ND, NT  Musculoskeletal: no cyanosis or clubbing   Data Reviewed: Basic Metabolic Panel:  Recent Labs Lab 09/10/13 2347 09/11/13 0350  NA 127* 128*  K 4.1 3.9  CL 90* 92*  CO2 24 23  GLUCOSE 115* 142*  BUN 4* 4*  CREATININE 0.63 0.61  CALCIUM 9.5 8.9  MG  --  1.7  PHOS  --  3.2   Liver Function Tests:  Recent Labs Lab 09/10/13 2347 09/11/13 0350  AST 19 17  ALT 12 12  ALKPHOS  81 78  BILITOT 0.4 0.4  PROT 6.5 6.1  ALBUMIN 3.5 3.2*   No results found for this basename: LIPASE, AMYLASE,  in the last 168 hours No results found for this basename: AMMONIA,  in the last 168 hours CBC:  Recent Labs Lab 09/10/13 2347 09/11/13 0350  WBC 9.5 6.8  NEUTROABS 3.7  --   HGB 12.4 11.8*  HCT 34.7* 33.3*  MCV 86.5 86.7  PLT 296 279   Cardiac Enzymes: No results found for this basename: CKTOTAL, CKMB, CKMBINDEX, TROPONINI,  in the last 168 hours BNP (last 3 results) No results found for this basename: PROBNP,  in the last 8760 hours CBG: No results found for this basename: GLUCAP,  in the last 168 hours  Recent Results (from the past 240 hour(s))  MRSA PCR SCREENING     Status: None   Collection Time    09/11/13  2:00 AM      Result Value Ref Range Status   MRSA by PCR NEGATIVE  NEGATIVE Final   Comment:            The GeneXpert MRSA Assay (FDA     approved for NASAL specimens     only), is one component of a     comprehensive MRSA colonization     surveillance  program. It is not     intended to diagnose MRSA     infection nor to guide or     monitor treatment for     MRSA infections.     Studies: X-ray Chest Pa And Lateral   09/11/2013   CLINICAL DATA:  Cough, short of breath, chest pain  EXAM: CHEST  2 VIEW  COMPARISON:  Prior chest x-ray 08/19/2006  FINDINGS: Cardiac and mediastinal contours are within normal limits. Atherosclerotic calcifications present within the transverse aorta. No focal airspace consolidation, pleural effusion, pulmonary edema or pneumothorax. No suspicious pulmonary nodule. Central bronchitic changes and bibasilar interstitial prominence are similar compared to prior imaging and therefore chronic. Slight pulmonary hyper expansion. No acute osseous abnormality.  IMPRESSION: Stable chest x-ray without evidence of acute cardiopulmonary process.  Aortic atherosclerosis, central bronchitic changes and chronic interstitial prominence in  the bases.   Electronically Signed   By: Jacqulynn Cadet M.D.   On: 09/11/2013 10:04   Mr Jeri Cos KP Contrast  09/10/2013   CLINICAL DATA:  Confusion.  Abnormal head CT  EXAM: MRI HEAD WITHOUT AND WITH CONTRAST  TECHNIQUE: Multiplanar, multiecho pulse sequences of the brain and surrounding structures were obtained without and with intravenous contrast.  CONTRAST:  40mL MULTIHANCE GADOBENATE DIMEGLUMINE 529 MG/ML IV SOLN  COMPARISON:  CT head 09/09/2013  FINDINGS: Multiple enhancing mass lesions are present consistent with extensive metastatic disease to the brain. The largest lesion is in the right occipital lobe and measures 27 x 24 mm and contains a small amount of hemorrhage. There is considerable edema in the right parietal lobe related to this lesion. 14 x 18 mm lesion in the superior cerebellar vermis. 13 x 17 mm lesion in the left inferior cerebellum. There is moderate edema in the left cerebellum. 13 mm lesion in the left thalamus.  Approximately 20 metastatic deposits are present in the brain.  3 mm midline shift to the left. No acute infarct. Ventricles are not enlarged.  IMPRESSION: Widespread metastatic disease to the brain. Approximately 20 lesions are present. The largest lesion in the right occipital lobe contains a mild amount of hemorrhage. There is a large amount of edema in the right parietal white matter and a large amount of edema in the left cerebellum  Mild midline shift to the left of approximately 3 mm.  These results were called by telephone at the time of interpretation on 09/10/2013 at 8:51 pm to Dr. Joylene Draft , who verbally acknowledged these results.   Electronically Signed   By: Franchot Gallo M.D.   On: 09/10/2013 20:52    Scheduled Meds: . dexamethasone  10 mg Intravenous 4 times per day  . docusate sodium  100 mg Oral BID  . guaiFENesin  600 mg Oral BID  . metoprolol succinate  25 mg Oral Daily  . [START ON 09/13/2013] nicotine  21 mg Transdermal Daily  . pneumococcal 23  valent vaccine  0.5 mL Intramuscular Tomorrow-1000  . sodium chloride  3 mL Intravenous Q12H   Continuous Infusions:    Time spent: > 35 minutes    Velvet Bathe  [facility] Pager [phone]. If 7PM-7AM, please contact night-coverage at [email or web address], password [facility] 09/12/2013, 10:57 AM  LOS: 2 days

## 2013-09-12 NOTE — Consult Note (Signed)
Reason for Consult: Weakness falls metastatic brain cancer Referring Physician: Emergency department  Holly Taylor is an 80 y.o. female.  HPI: This is a late entry note in his [facility]es[facility] Zehnder I spent an extensive amount time talking with her yesterday but did not get a chance to dictate this note. She had a couple months of progressive worsening falls and generalized weakness have progressed to the point where she had an outpatient head CT ordered that showed multiple brain lesions underwent an MRI scan which confirmed this and she was sent to the ER for workup and admission. She currently denies any significant headache or nausea just global weakness difficulty ambulating and multiple falls.  Past Medical History  Diagnosis Date  . Coronary artery disease   . Hyperlipidemia   . Hypertension   . Peripheral vascular disease   . Cerebrovascular disease   . Carotid artery disease   . Shortness of breath     Past Surgical History  Procedure Laterality Date  . Femoral pseudoaneurysm      repair of right   . Pseudoaneurysm repair    . Aortogram    . Abdomina aorto    . Endarterectomy      Family History  Problem Relation Age of Onset  . Coronary artery disease      negative family hx of  . Cancer Brother   . Parkinson's disease Mother   . Stroke Father   . Stroke Sister   . Skin cancer Brother   . Seizures Brother     Social History:  reports that she has been smoking Cigarettes.  She has been smoking about 0.50 packs per day. She does not have any smokeless tobacco history on file. She reports that she does not drink alcohol or use illicit drugs.  Allergies: No Known Allergies  Medications: I have reviewed the patient's current medications.  Results for orders placed during the hospital encounter of 09/10/13 (from the past 48 hour(s))  CBC WITH DIFFERENTIAL     Status: Abnormal   Collection Time    09/10/13 11:47 PM      Result Value Ref Range   WBC 9.5  4.0 - 10.5 K/uL   RBC 4.01  3.87 - 5.11 MIL/uL   Hemoglobin 12.4  12.0 - 15.0 g/dL   HCT 34.7 (*) 36.0 - 46.0 %   MCV 86.5  78.0 - 100.0 fL   MCH 30.9  26.0 - 34.0 pg   MCHC 35.7  30.0 - 36.0 g/dL   RDW 12.2  11.5 - 15.5 %   Platelets 296  150 - 400 K/uL   Neutrophils Relative % 40 (*) 43 - 77 %   Neutro Abs 3.7  1.7 - 7.7 K/uL   Lymphocytes Relative 40  12 - 46 %   Lymphs Abs 3.9  0.7 - 4.0 K/uL   Monocytes Relative 10  3 - 12 %   Monocytes Absolute 0.9  0.1 - 1.0 K/uL   Eosinophils Relative 9 (*) 0 - 5 %   Eosinophils Absolute 0.9 (*) 0.0 - 0.7 K/uL   Basophils Relative 1  0 - 1 %   Basophils Absolute 0.1  0.0 - 0.1 K/uL  PROTIME-INR     Status: None   Collection Time    09/10/13 11:47 PM      Result Value Ref Range   Prothrombin Time 12.5  11.6 - 15.2 seconds   INR 0.93  0.00 - 1.49  COMPREHENSIVE METABOLIC PANEL  Status: Abnormal   Collection Time    09/10/13 11:47 PM      Result Value Ref Range   Sodium 127 (*) 137 - 147 mEq/L   Potassium 4.1  3.7 - 5.3 mEq/L   Chloride 90 (*) 96 - 112 mEq/L   CO2 24  19 - 32 mEq/L   Glucose, Bld 115 (*) 70 - 99 mg/dL   BUN 4 (*) 6 - 23 mg/dL   Creatinine, Ser 0.63  0.50 - 1.10 mg/dL   Calcium 9.5  8.4 - 10.5 mg/dL   Total Protein 6.5  6.0 - 8.3 g/dL   Albumin 3.5  3.5 - 5.2 g/dL   AST 19  0 - 37 U/L   ALT 12  0 - 35 U/L   Alkaline Phosphatase 81  39 - 117 U/L   Total Bilirubin 0.4  0.3 - 1.2 mg/dL   GFR calc non Af Amer 81 (*) >90 mL/min   GFR calc Af Amer >90  >90 mL/min   Comment: (NOTE)     The eGFR has been calculated using the CKD EPI equation.     This calculation has not been validated in all clinical situations.     eGFR's persistently <90 mL/min signify possible Chronic Kidney     Disease.   Anion gap 13  5 - 15  APTT     Status: None   Collection Time    09/10/13 11:47 PM      Result Value Ref Range   aPTT 31  24 - 37 seconds  MRSA PCR SCREENING     Status: None   Collection Time    09/11/13  2:00 AM      Result Value Ref  Range   MRSA by PCR NEGATIVE  NEGATIVE   Comment:            The GeneXpert MRSA Assay (FDA     approved for NASAL specimens     only), is one component of a     comprehensive MRSA colonization     surveillance program. It is not     intended to diagnose MRSA     infection nor to guide or     monitor treatment for     MRSA infections.  MAGNESIUM     Status: None   Collection Time    09/11/13  3:50 AM      Result Value Ref Range   Magnesium 1.7  1.5 - 2.5 mg/dL  PHOSPHORUS     Status: None   Collection Time    09/11/13  3:50 AM      Result Value Ref Range   Phosphorus 3.2  2.3 - 4.6 mg/dL  TSH     Status: None   Collection Time    09/11/13  3:50 AM      Result Value Ref Range   TSH 1.580  0.350 - 4.500 uIU/mL   Comment: Performed at [place] PANEL     Status: Abnormal   Collection Time    09/11/13  3:50 AM      Result Value Ref Range   Sodium 128 (*) 137 - 147 mEq/L   Potassium 3.9  3.7 - 5.3 mEq/L   Chloride 92 (*) 96 - 112 mEq/L   CO2 23  19 - 32 mEq/L   Glucose, Bld 142 (*) 70 - 99 mg/dL   BUN 4 (*) 6 - 23 mg/dL   Creatinine, Ser 0.61  0.50 -  1.10 mg/dL   Calcium 8.9  8.4 - 10.5 mg/dL   Total Protein 6.1  6.0 - 8.3 g/dL   Albumin 3.2 (*) 3.5 - 5.2 g/dL   AST 17  0 - 37 U/L   ALT 12  0 - 35 U/L   Alkaline Phosphatase 78  39 - 117 U/L   Total Bilirubin 0.4  0.3 - 1.2 mg/dL   GFR calc non Af Amer 82 (*) >90 mL/min   GFR calc Af Amer >90  >90 mL/min   Comment: (NOTE)     The eGFR has been calculated using the CKD EPI equation.     This calculation has not been validated in all clinical situations.     eGFR's persistently <90 mL/min signify possible Chronic Kidney     Disease.   Anion gap 13  5 - 15  CBC     Status: Abnormal   Collection Time    09/11/13  3:50 AM      Result Value Ref Range   WBC 6.8  4.0 - 10.5 K/uL   RBC 3.84 (*) 3.87 - 5.11 MIL/uL   Hemoglobin 11.8 (*) 12.0 - 15.0 g/dL   HCT 33.3 (*) 36.0 - 46.0 %   MCV  86.7  78.0 - 100.0 fL   MCH 30.7  26.0 - 34.0 pg   MCHC 35.4  30.0 - 36.0 g/dL   RDW 12.2  11.5 - 15.5 %   Platelets 279  150 - 400 K/uL  CEA     Status: Abnormal   Collection Time    09/11/13  3:50 AM      Result Value Ref Range   CEA 52.8 (*) 0.0 - 5.0 ng/mL   Comment: Performed at [facility]    X-ray Chest Pa And Lateral   09/11/2013   CLINICAL DATA:  Cough, short of breath, chest pain  EXAM: CHEST  2 VIEW  COMPARISON:  Prior chest x-ray 08/19/2006  FINDINGS: Cardiac and mediastinal contours are within normal limits. Atherosclerotic calcifications present within the transverse aorta. No focal airspace consolidation, pleural effusion, pulmonary edema or pneumothorax. No suspicious pulmonary nodule. Central bronchitic changes and bibasilar interstitial prominence are similar compared to prior imaging and therefore chronic. Slight pulmonary hyper expansion. No acute osseous abnormality.  IMPRESSION: Stable chest x-ray without evidence of acute cardiopulmonary process.  Aortic atherosclerosis, central bronchitic changes and chronic interstitial prominence in the bases.   Electronically Signed   By: Jacqulynn Cadet M.D.   On: 09/11/2013 10:04   Mr Jeri Cos WF Contrast  09/10/2013   CLINICAL DATA:  Confusion.  Abnormal head CT  EXAM: MRI HEAD WITHOUT AND WITH CONTRAST  TECHNIQUE: Multiplanar, multiecho pulse sequences of the brain and surrounding structures were obtained without and with intravenous contrast.  CONTRAST:  1m MULTIHANCE GADOBENATE DIMEGLUMINE 529 MG/ML IV SOLN  COMPARISON:  CT head 09/09/2013  FINDINGS: Multiple enhancing mass lesions are present consistent with extensive metastatic disease to the brain. The largest lesion is in the right occipital lobe and measures 27 x 24 mm and contains a small amount of hemorrhage. There is considerable edema in the right parietal lobe related to this lesion. 14 x 18 mm lesion in the superior cerebellar vermis. 13 x 17 mm lesion in the  left inferior cerebellum. There is moderate edema in the left cerebellum. 13 mm lesion in the left thalamus.  Approximately 20 metastatic deposits are present in the brain.  3 mm midline shift to the left.  No acute infarct. Ventricles are not enlarged.  IMPRESSION: Widespread metastatic disease to the brain. Approximately 20 lesions are present. The largest lesion in the right occipital lobe contains a mild amount of hemorrhage. There is a large amount of edema in the right parietal white matter and a large amount of edema in the left cerebellum  Mild midline shift to the left of approximately 3 mm.  These results were called by telephone at the time of interpretation on 09/10/2013 at 8:51 pm to Dr. Joylene Draft , who verbally acknowledged these results.   Electronically Signed   By: Franchot Gallo M.D.   On: 09/10/2013 20:52    Review of Systems  Constitutional: Negative.   HENT: Negative.   Eyes: Negative.   Respiratory: Negative.   Cardiovascular: Negative.   Gastrointestinal: Negative.   Genitourinary: Negative.   Musculoskeletal: Negative.   Skin: Negative.   Neurological: Positive for focal weakness.  Endo/Heme/Allergies: Negative.   Psychiatric/Behavioral: Negative.    Blood pressure 122/64, pulse 74, temperature 97.7 F (36.5 C), temperature source Oral, resp. rate 20, height _0  (1.702 m), weight 63.6 kg (140 lb 3.4 oz), SpO2 95.00%. Physical Exam  Constitutional: She is oriented to person, place, and time. She appears well-developed and well-nourished.  HENT:  Head: Normocephalic.  Eyes: Pupils are equal, round, and reactive to light.  Neck: Normal range of motion.  Respiratory: Effort normal.  GI: Soft.  Neurological: She is alert and oriented to person, place, and time. She has normal strength. GCS eye subscore is 4. GCS verbal subscore is 5. GCS motor subscore is 6.  Reflex Scores:      Tricep reflexes are 2+ on the right side and 2+ on the left side.      Bicep reflexes are 2+  on the right side and 2+ on the left side.      Brachioradialis reflexes are 2+ on the right side and 2+ on the left side.      Patellar reflexes are 2+ on the right side and 2+ on the left side.      Achilles reflexes are 2+ on the right side and 2+ on the left side. Patient is awake alert oriented pupils are equal extraocular movements are intact cranial nerves are intact strength is 5 out of 5 in her upper and lower extremities    Assessment/Plan: 80 year old female with extensive metastatic carcinoma to the brain multiple small lesions and none are really surgical at this point I would recommend continue systemic workup and biopsy so the systemic disease consult oncology and radiation oncology probably no surgical intervention would be needed in the brain I do not recommend it.  Kahliya Fraleigh P 09/12/2013, 1:11 PM

## 2013-09-12 NOTE — Progress Notes (Signed)
INITIAL NUTRITION ASSESSMENT  DOCUMENTATION CODES Per approved criteria  -Not Applicable   INTERVENTION: -Ensure Complete BID, each provides 350 kcal, 13 g protein -Encourage adequate intake of meals.  NUTRITION DIAGNOSIS: Inadequate oral intake related to decreased appetite as evidenced by family report.   Goal: Patient will meet >/=90% of estimated nutrition needs  Monitor:  PO intake, weight, labs  Reason for Assessment: Malnutrition screening tool  80 y.o. female  Admitting Dx: Frequent falls  ASSESSMENT: 80 year old female with history of coronary artery disease, hyperlipidemia, hypertension, PVD, CVA with a 2 month history of falls. CT scan showed extensive metastatic disease of the brain of unclear etiology. Largest lesion in the right occipital lobe contains mild amount of hemorrhage.   Patient's daughter reports that the patient has not had any change in appetite. However, she typically doesn't eat much, having a good breakfast of cereal with fruit. Lunch and dinner are usually a sandwich. She has lost some weight (10 pounds in the last 8 months) and decreased in size from a 14 to an 8, but daughter reports that while weight has not changed a lot, her muscle mass is noticeably depleted. They are open to nutrition supplements.   Height: Ht Readings from Last 1 Encounters:  09/11/13 5\' 7"  (1.702 m)    Weight: Wt Readings from Last 1 Encounters:  09/12/13 140 lb 3.4 oz (63.6 kg)    Ideal Body Weight: 135 pounds  % Ideal Body Weight: 104%  Wt Readings from Last 10 Encounters:  09/12/13 140 lb 3.4 oz (63.6 kg)  11/03/12 151 lb (68.493 kg)  05/11/10 162 lb 1.9 oz (73.537 kg)  04/13/09 158 lb (71.668 kg)    Usual Body Weight: 150 pounds  % Usual Body Weight: 93%  BMI:  Body mass index is 21.96 kg/(m^2). Patient is normal weight.  Estimated Nutritional Needs: Kcal: 1450-1600 kcal Protein: 75-85 g Fluid: >1.9 L/day  Skin: Intact  Diet Order:  General  EDUCATION NEEDS: -No education needs identified at this time   Intake/Output Summary (Last 24 hours) at 09/12/13 1238 Last data filed at 09/12/13 1208  Gross per 24 hour  Intake      0 ml  Output   2550 ml  Net  -2550 ml    Last BM: PTA   Labs:   Recent Labs Lab 09/10/13 2347 09/11/13 0350  NA 127* 128*  K 4.1 3.9  CL 90* 92*  CO2 24 23  BUN 4* 4*  CREATININE 0.63 0.61  CALCIUM 9.5 8.9  MG  --  1.7  PHOS  --  3.2  GLUCOSE 115* 142*    CBG (last 3)  No results found for this basename: GLUCAP,  in the last 72 hours  Scheduled Meds: . dexamethasone  10 mg Intravenous 4 times per day  . docusate sodium  100 mg Oral BID  . guaiFENesin  600 mg Oral BID  . metoprolol succinate  25 mg Oral Daily  . nicotine  21 mg Transdermal Daily  . sodium chloride  3 mL Intravenous Q12H    Continuous Infusions:   Past Medical History  Diagnosis Date  . Coronary artery disease   . Hyperlipidemia   . Hypertension   . Peripheral vascular disease   . Cerebrovascular disease   . Carotid artery disease   . Shortness of breath     Past Surgical History  Procedure Laterality Date  . Femoral pseudoaneurysm      repair of right   .  Pseudoaneurysm repair    . Aortogram    . Abdomina aorto    . Endarterectomy      Larey Seat, RD, LDN Pager #: [phone] After-Hours Pager #: [phone]

## 2013-09-13 ENCOUNTER — Other Ambulatory Visit: Payer: Medicare Other

## 2013-09-13 MED ORDER — ENSURE COMPLETE PO LIQD: 237.0000 mL | Freq: Two times a day (BID) | ORAL | Status: AC

## 2013-09-13 MED ORDER — DSS 100 MG PO CAPS: 100.0000 mg | ORAL_CAPSULE | Freq: Two times a day (BID) | ORAL | Status: AC

## 2013-09-13 MED ORDER — HYDROXYZINE HCL 25 MG PO TABS: 25.0000 mg | ORAL_TABLET | Freq: Three times a day (TID) | ORAL | Status: AC | PRN

## 2013-09-13 MED ORDER — DEXAMETHASONE 4 MG PO TABS: 4.0000 mg | ORAL_TABLET | Freq: Two times a day (BID) | ORAL | Status: AC

## 2013-09-13 NOTE — Progress Notes (Signed)
Consult to assess pt for bronchoscopy.  Pt has since been transitioned to comfort measures.  PCCM will sign off.  Please call if additional help needed.  Chesley Mires, MD [facility] Pulmonary/Critical Care 09/13/2013, 11:37 AM Pager:  [phone] After 3pm call: [phone]

## 2013-09-13 NOTE — Care Management Note (Addendum)
    Page 1 of 2   09/13/2013     4:26:19 PM CARE MANAGEMENT NOTE 09/13/2013  Patient:  Holly Taylor, Holly Taylor   Account Number:  [id number]  Date Initiated:  09/13/2013  Documentation initiated by:  Dessa Phi  Subjective/Objective Assessment:   80 Y/O F ADMITTED W/BRAIN LESIONS.     Action/Plan:   FROM HOME.   Anticipated DC Date:  09/13/2013   Anticipated DC Plan:  [place]  CM consult      Choice offered to / List presented to:  C-4 Adult Children   DME arranged  Vassie Moselle      DME agency  [place] arranged  HH-1 RN  [place].   Status of service:  Completed, signed off Medicare Important Message given?   (If response is "NO", the following Medicare IM given date fields will be blank) Date Medicare IM given:   Medicare IM given by:   Date Additional Medicare IM given:   Additional Medicare IM given by:    Discharge Disposition:  [place]  Per UR Regulation:  Reviewed for med. necessity/level of care/duration of stay  If discussed at [place] of Stay Meetings, dates discussed:    Comments:  09/13/13 Holly Haseman RN,BSN NCM [phone] 4:30P-[facility] DME RW ORDERED.[facility] KRISTEN AWARE.  SPOKE TO DTR ABOUT D/C PLANS.[facility] CHOSEN FOR HHRN/HHPT.DTR WILL F/U WITH PCP-DR. AVA FOR HOME HOSPICE SERVICES.PROVIDED DTR W/HOME HOSPICE PROVIDERS AS RESOURCE.DTR HAS A RW,& STATES SHE DOESN'T NEED THE RW THAT WAS ORDERED DURING A PRIOR ADMISSION.MD UPDATED.

## 2013-09-13 NOTE — Progress Notes (Signed)
Patient ID: Holly Taylor, female   DOB: 1928/08/31, 80 y.o.   MRN: [id number]  Request made for biopsy of LAN  Dr Laurence Ferrari has reviewed imaging NO safe place for percutaneous biopsy  May consider Pulmonary consult May be able to bronchoscopically sample mediastinal lymphadenopathy  Called and discussed with Dr Wendee Beavers Aware and agreeable

## 2013-09-13 NOTE — Discharge Summary (Signed)
Physician Discharge Summary  Holly Taylor KXF:[id number] DOB: 1928-09-27 DOA: 09/10/2013  PCP: Tivis Ringer, MD  Admit date: 09/10/2013 Discharge date: 09/13/2013  Time spent: > 35  minutes  Recommendations for Outpatient Follow-up:  1. Continue palliative care measures 2. 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   Discharge Condition: Stable  Diet recommendation: Heart healthy  Filed Weights   09/11/13 0159 09/12/13 0535 09/13/13 0100  Weight: 63.8 kg (140 lb 10.5 oz) 63.6 kg (140 lb 3.4 oz) 61.78 kg (136 lb 3.2 oz)    History of present illness:  80 year old Caucasian female past medical history of coronary artery disease, hyperlipidemia, hypertension, peripheral vascular disease, cerebrovascular disease, coronary artery disease who presented to the ED after complaints of falls evaluation by PCP of patient's head PS CT imaging showed 20 masses in the brain  Hospital Course:  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  - Obtained CT scan of the chest, abdomen, and pelvis  - Consulted IR to obtain biopsy for further evaluation but they were unable to do so and recommended I call Pulmonologist to see if they were able to do so. Family did not want to pursue further workup and prefer to take patient home with home hospice. - Continue Decadron   Active Problems:  CAD - Continue statin but discontinue aspirin given reports of blood noticed on CT imaging of brain  HYPERTENSION  - Metoprolol on board and BP relatively well controlled   Procedures:  As mentioned above  Consultations:  Pulmonology,   interventional radiology  Discharge Exam: Filed Vitals:   09/13/13 0800  BP: 146/50  Pulse:  83  Temp: 98.6 F (37 C)  Resp:     General: Patient in no acute distress, alert Cardiovascular: Regular rate and rhythm, no murmur Respiratory: Clear to auscultation bilaterally, no wheezes  Discharge Instructions You were cared for by a hospitalist during your hospital stay. If you have any questions about your discharge medications or the care you received while you were in the hospital after you are discharged, you can call the unit and asked to speak with the hospitalist on call if the hospitalist that took care of you is not available. Once you are discharged, your primary care physician will handle any further medical issues. Please note that NO REFILLS for any discharge medications will be authorized once you are discharged, as it is imperative that you return to your primary care physician (or establish a relationship with a primary care physician if you do not have one) for your aftercare needs so that they can reassess your need for medications and monitor your lab values.  Discharge Instructions   Call MD for:  severe uncontrolled pain    Complete by:  As directed      Diet - low sodium heart healthy    Complete by:  As directed      Discharge instructions    Complete by:  As directed   Pt will be discharged home with home health and someone from hospice we'll go over to their house for evaluation     Increase activity slowly    Complete by:  As directed  Medication List    STOP taking these medications       aspirin 325 MG tablet     LORazepam 1 MG tablet  Commonly known as:  ATIVAN      TAKE these medications       amLODipine 5 MG tablet  Commonly known as:  NORVASC  Take 5 mg by mouth daily.     dexamethasone 4 MG tablet  Commonly known as:  DECADRON  Take 1 tablet (4 mg total) by mouth 2 (two) times daily.     DSS 100 MG Caps  Take 100 mg by mouth 2 (two) times daily.     feeding supplement (ENSURE COMPLETE) Liqd  Take 237 mLs by mouth 2  (two) times daily between meals.     hydrOXYzine 25 MG tablet  Commonly known as:  ATARAX/VISTARIL  Take 1 tablet (25 mg total) by mouth 3 (three) times daily as needed for anxiety.     metoprolol succinate 25 MG 24 hr tablet  Commonly known as:  TOPROL-XL  Take 1 tablet (25 mg total) by mouth daily.     multivitamin tablet  Take 1 tablet by mouth daily.     omega-3 acid ethyl esters 1 G capsule  Commonly known as:  LOVAZA  Take 1 g by mouth daily.     simvastatin 20 MG tablet  Commonly known as:  ZOCOR  Take 20 mg by mouth at bedtime.     vitamin B-12 1000 MCG tablet  Commonly known as:  CYANOCOBALAMIN  Take 1,000 mcg by mouth daily.       No Known Allergies    The results of significant diagnostics from this hospitalization (including imaging, microbiology, ancillary and laboratory) are listed below for reference.    Significant Diagnostic Studies: X-ray Chest Pa And Lateral   09/11/2013   CLINICAL DATA:  Cough, short of breath, chest pain  EXAM: CHEST  2 VIEW  COMPARISON:  Prior chest x-ray 08/19/2006  FINDINGS: Cardiac and mediastinal contours are within normal limits. Atherosclerotic calcifications present within the transverse aorta. No focal airspace consolidation, pleural effusion, pulmonary edema or pneumothorax. No suspicious pulmonary nodule. Central bronchitic changes and bibasilar interstitial prominence are similar compared to prior imaging and therefore chronic. Slight pulmonary hyper expansion. No acute osseous abnormality.  IMPRESSION: Stable chest x-ray without evidence of acute cardiopulmonary process.  Aortic atherosclerosis, central bronchitic changes and chronic interstitial prominence in the bases.   Electronically Signed   By: Jacqulynn Cadet M.D.   On: 09/11/2013 10:04   Ct Head Wo Contrast  09/09/2013   CLINICAL DATA:  80 year old female with orthostatics hypotension. Memory loss and confusion. Initial encounter. No known malignancy.  EXAM: CT HEAD  WITHOUT CONTRAST  TECHNIQUE: Contiguous axial images were obtained from the base of the skull through the vertex without intravenous contrast.  COMPARISON:  [facility] head CT 08/07/2011, and earlier.  FINDINGS: Visualized paranasal sinuses and mastoids are clear. Calcified atherosclerosis at the skull base. Postoperative changes to the globes. Negative scalp soft tissues. No acute or suspicious osseous lesion identified.  New abnormal hypodensity, widespread in the right superior frontal lobe is in a vasogenic edema pattern and appears related to a 25 mm soft tissue mass located within the medial right parietal lobe versus along the right interhemispheric fissure. There is also abnormal vasogenic edema pattern hypodensity in the left superior frontal gyrus. There is a small focus of edema in the left frontal operculum (image 17). There is possibly  also a small focus of edema in the left occipital pole (image 16). There is also abnormal hypodensity which is more amorphous in the central superior cerebellum, tracking to the left. Subtle evidence of a left cerebellar mass which may measure up to 2 cm on image 8.  Despite these findings, no impending herniation or loss of the basilar cisterns. There is mild leftward midline shift of 4-5 mm. No ventriculomegaly. No acute intracranial hemorrhage identified. No superimposed acute cortically based infarct identified. No suspicious intracranial vascular hyperdensity.  IMPRESSION: 1. New multifocal cerebral edema suggesting multiple Brain masses, at least 5 lesions suspected on the basis of this study. Favor metastatic disease to the brain. Followup brain MRI (without and with contrast) or alternatively post-contrast head CT would characterize further. 2. No impending herniation. No ventriculomegaly. There is mild leftward midline shift. Critical Value/emergent results were called by telephone at the time of interpretation on 09/09/2013 at 4:40 pm to Dr. Prince Solian , who verbally acknowledged these results.   Electronically Signed   By: Lars Pinks M.D.   On: 09/09/2013 16:45   Ct Chest W Contrast  09/12/2013   CLINICAL DATA:  Intracranial metastatic disease.  EXAM: CT CHEST, ABDOMEN, AND PELVIS WITH CONTRAST  TECHNIQUE: Multidetector CT imaging of the chest, abdomen and pelvis was performed following the standard protocol during bolus administration of intravenous contrast.  CONTRAST:  163mL OMNIPAQUE IOHEXOL 300 MG/ML  SOLN  COMPARISON:  MRI scan of September 10, 2013; CT scan of July 15, 2011.  FINDINGS: CT CHEST FINDINGS  No pneumothorax or pleural effusion is noted. 5 mm nodule is noted posteriorly in the left lung base. Minimal scarring and pleural thickening is noted posteriorly in right lung base. Atherosclerotic calcifications of thoracic aorta are noted without dissection or aneurysm formation. Coronary artery calcifications are noted. 30 x 29 mm lobulated mass is noted in the aortopulmonary window. 10 x 9 mm precarinal lymph node is noted. Pulmonary arteries appear normal. Fat containing abnormality measuring 10.3 x 8.0 x 4.1 cm is seen along the right posterior chest wall most consistent with lipoma.  CT ABDOMEN AND PELVIS FINDINGS  Minimal cholelithiasis is noted. No focal abnormality is noted in the liver, spleen or pancreas. Adrenal glands appear normal. Left kidney appears normal. Moderate right renal atrophy is noted with cortical scarring. No hydronephrosis or renal obstruction is noted. The appendix appears normal. There is no evidence of bowel obstruction. Atherosclerotic calcifications of abdominal aorta and mesenteric arteries are noted without aneurysm formation. No abnormal fluid collection is noted. Urinary bladder appears normal. Uterus and ovaries appear normal. No significant adenopathy is noted in the abdomen or pelvis. Moderate wedge compression deformity of L1 vertebral body is noted with several lucencies within it. Pathologic fracture cannot  be excluded.  IMPRESSION: Minimal cholelithiasis.  Moderate right renal atrophy is noted with associated cortical scarring.  Fat containing mass measuring 10.3 x 8.0 x 4.1 cm is seen along right posterior chest wall most consistent with lipoma.  Adenopathy measuring 3.0 x 2.9 cm is noted in the aortopulmonary window of the mediastinum consistent with metastatic disease. Smaller sub cm pretracheal lymph nodes are noted.  Moderate wedge compression deformity of L1 vertebral body is noted which grossly looks like old compression fracture, but lucencies are present within it and pathologic fracture or metastatic disease cannot be ruled out.  5 mm nodule is noted posteriorly in the left lung base. This may simply represent scarring, but given the other findings described on  this exam and on the prior MRI, metastatic disease cannot be excluded.   Electronically Signed   By: Sabino Dick M.D.   On: 09/12/2013 13:51   Mr Jeri Cos TG Contrast  09/10/2013   CLINICAL DATA:  Confusion.  Abnormal head CT  EXAM: MRI HEAD WITHOUT AND WITH CONTRAST  TECHNIQUE: Multiplanar, multiecho pulse sequences of the brain and surrounding structures were obtained without and with intravenous contrast.  CONTRAST:  12mL MULTIHANCE GADOBENATE DIMEGLUMINE 529 MG/ML IV SOLN  COMPARISON:  CT head 09/09/2013  FINDINGS: Multiple enhancing mass lesions are present consistent with extensive metastatic disease to the brain. The largest lesion is in the right occipital lobe and measures 27 x 24 mm and contains a small amount of hemorrhage. There is considerable edema in the right parietal lobe related to this lesion. 14 x 18 mm lesion in the superior cerebellar vermis. 13 x 17 mm lesion in the left inferior cerebellum. There is moderate edema in the left cerebellum. 13 mm lesion in the left thalamus.  Approximately 20 metastatic deposits are present in the brain.  3 mm midline shift to the left. No acute infarct. Ventricles are not enlarged.   IMPRESSION: Widespread metastatic disease to the brain. Approximately 20 lesions are present. The largest lesion in the right occipital lobe contains a mild amount of hemorrhage. There is a large amount of edema in the right parietal white matter and a large amount of edema in the left cerebellum  Mild midline shift to the left of approximately 3 mm.  These results were called by telephone at the time of interpretation on 09/10/2013 at 8:51 pm to Dr. Joylene Draft , who verbally acknowledged these results.   Electronically Signed   By: Franchot Gallo M.D.   On: 09/10/2013 20:52   Ct Abdomen Pelvis W Contrast  09/12/2013   CLINICAL DATA:  Intracranial metastatic disease.  EXAM: CT CHEST, ABDOMEN, AND PELVIS WITH CONTRAST  TECHNIQUE: Multidetector CT imaging of the chest, abdomen and pelvis was performed following the standard protocol during bolus administration of intravenous contrast.  CONTRAST:  137mL OMNIPAQUE IOHEXOL 300 MG/ML  SOLN  COMPARISON:  MRI scan of September 10, 2013; CT scan of July 15, 2011.  FINDINGS: CT CHEST FINDINGS  No pneumothorax or pleural effusion is noted. 5 mm nodule is noted posteriorly in the left lung base. Minimal scarring and pleural thickening is noted posteriorly in right lung base. Atherosclerotic calcifications of thoracic aorta are noted without dissection or aneurysm formation. Coronary artery calcifications are noted. 30 x 29 mm lobulated mass is noted in the aortopulmonary window. 10 x 9 mm precarinal lymph node is noted. Pulmonary arteries appear normal. Fat containing abnormality measuring 10.3 x 8.0 x 4.1 cm is seen along the right posterior chest wall most consistent with lipoma.  CT ABDOMEN AND PELVIS FINDINGS  Minimal cholelithiasis is noted. No focal abnormality is noted in the liver, spleen or pancreas. Adrenal glands appear normal. Left kidney appears normal. Moderate right renal atrophy is noted with cortical scarring. No hydronephrosis or renal obstruction is noted. The  appendix appears normal. There is no evidence of bowel obstruction. Atherosclerotic calcifications of abdominal aorta and mesenteric arteries are noted without aneurysm formation. No abnormal fluid collection is noted. Urinary bladder appears normal. Uterus and ovaries appear normal. No significant adenopathy is noted in the abdomen or pelvis. Moderate wedge compression deformity of L1 vertebral body is noted with several lucencies within it. Pathologic fracture cannot be excluded.  IMPRESSION: Minimal cholelithiasis.  Moderate right renal atrophy is noted with associated cortical scarring.  Fat containing mass measuring 10.3 x 8.0 x 4.1 cm is seen along right posterior chest wall most consistent with lipoma.  Adenopathy measuring 3.0 x 2.9 cm is noted in the aortopulmonary window of the mediastinum consistent with metastatic disease. Smaller sub cm pretracheal lymph nodes are noted.  Moderate wedge compression deformity of L1 vertebral body is noted which grossly looks like old compression fracture, but lucencies are present within it and pathologic fracture or metastatic disease cannot be ruled out.  5 mm nodule is noted posteriorly in the left lung base. This may simply represent scarring, but given the other findings described on this exam and on the prior MRI, metastatic disease cannot be excluded.   Electronically Signed   By: Sabino Dick M.D.   On: 09/12/2013 13:51   US Carotid Bilateral  09/09/2013   CLINICAL DATA:  CAD, syncopal episode, possible TIA. History of hyperlipidemia and smoking. Carotid bruit. Possible history of vascular stents within the bilateral lower extremity arteries.  EXAM: BILATERAL CAROTID DUPLEX ULTRASOUND  TECHNIQUE: Pearline Cables scale imaging, color Doppler and duplex ultrasound were performed of bilateral carotid and vertebral arteries in the neck.  COMPARISON:  Head CT -08/07/2011  FINDINGS: Criteria: Quantification of carotid stenosis is based on velocity parameters that correlate the  residual internal carotid diameter with NASCET-based stenosis levels, using the diameter of the distal internal carotid lumen as the denominator for stenosis measurement.  The following velocity measurements were obtained:  RIGHT  ICA:  95/16 cm/sec  CCA:  17/5 cm/sec  SYSTOLIC ICA/CCA RATIO:  1.02  DIASTOLIC ICA/CCA RATIO:  5.85  ECA:  246 cm/sec  LEFT  ICA:  155/22 cm/sec  CCA:  277/8 cm/sec  SYSTOLIC ICA/CCA RATIO:  2.42  DIASTOLIC ICA/CCA RATIO:  3.53  ECA:  97 cm/sec  RIGHT CAROTID ARTERY: There is a moderate amount of scattered E centric echogenic foci of partially shadowing atherosclerotic plaque throughout the right common carotid artery (representative images or and 5). There is a moderate amount of eccentric mixed echogenic partially shadowing plaque within the right carotid bulb (images 6 and 7, extending to involve the origin and proximal aspect of the right internal carotid artery (image 8), not resulting in elevated peak systolic velocities in the interrogated course of the right internal carotid artery to suggest a hemodynamically significant stenosis.  RIGHT VERTEBRAL ARTERY:  Antegrade flow  LEFT CAROTID ARTERY: There is a moderate amount of eccentric echogenic partially shadowing plaque within the mid aspect of the left common carotid artery (images 37 and 49). There is a moderate to large amount of eccentric mixed echogenic partially shadowing plaque within the left carotid bulb (images 39 and 52), extending to involve the origin and proximal aspects of the left internal carotid artery (images 41, 42 and 43), resulting in elevated peak systolic velocities within the proximal aspect of the left internal carotid artery (greatest peak systolic velocity measuring 155 cm/sec - image 66).  LEFT VERTEBRAL ARTERY:  Antegrade flow  IMPRESSION: 1. Moderate to large amount of left-sided atherosclerotic plaque results in elevated peak systolic velocities within the left internal carotid artery compatible with  the 50-69% luminal narrowing range. Further evaluation with CTA could be performed as clinically indicated. 2. Moderate amount of right-sided atherosclerotic plaque, not definitely resulting in a hemodynamically significant stenosis.   Electronically Signed   By: Sandi Mariscal M.D.   On: 09/09/2013 15:53    Microbiology: Recent Results (from the  past 240 hour(s))  MRSA PCR SCREENING     Status: None   Collection Time    09/11/13  2:00 AM      Result Value Ref Range Status   MRSA by PCR NEGATIVE  NEGATIVE Final   Comment:            The GeneXpert MRSA Assay (FDA     approved for NASAL specimens     only), is one component of a     comprehensive MRSA colonization     surveillance program. It is not     intended to diagnose MRSA     infection nor to guide or     monitor treatment for     MRSA infections.     Labs: Basic Metabolic Panel:  Recent Labs Lab 09/10/13 2347 09/11/13 0350  NA 127* 128*  K 4.1 3.9  CL 90* 92*  CO2 24 23  GLUCOSE 115* 142*  BUN 4* 4*  CREATININE 0.63 0.61  CALCIUM 9.5 8.9  MG  --  1.7  PHOS  --  3.2   Liver Function Tests:  Recent Labs Lab 09/10/13 2347 09/11/13 0350  AST 19 17  ALT 12 12  ALKPHOS 81 78  BILITOT 0.4 0.4  PROT 6.5 6.1  ALBUMIN 3.5 3.2*   No results found for this basename: LIPASE, AMYLASE,  in the last 168 hours No results found for this basename: AMMONIA,  in the last 168 hours CBC:  Recent Labs Lab 09/10/13 2347 09/11/13 0350  WBC 9.5 6.8  NEUTROABS 3.7  --   HGB 12.4 11.8*  HCT 34.7* 33.3*  MCV 86.5 86.7  PLT 296 279   Cardiac Enzymes: No results found for this basename: CKTOTAL, CKMB, CKMBINDEX, TROPONINI,  in the last 168 hours BNP: BNP (last 3 results) No results found for this basename: PROBNP,  in the last 8760 hours CBG: No results found for this basename: GLUCAP,  in the last 168 hours     Signed:  Velvet Bathe  [facility] 09/13/2013, 3:28 PM

## 2013-09-13 NOTE — Progress Notes (Signed)
[facility]  [facility] is providing the following services: RW  If patient discharges after hours, please call [phone].   Holly Taylor 09/13/2013, 4:30 PM

## 2013-09-13 NOTE — Progress Notes (Signed)
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 - Place order for CT scan of the chest, abdomen, and pelvis - Consulted IR to obtain biopsy for further evaluation but they were unable to do so and recommended I call Pulmonologist to see if they were able to do so.   - Pulmonology consulted - Continue Decadron  Active Problems:   TOBACCO USER - Increased nicotine patch to appropriate dose given history of 1 ppd smoking history    HYPERTENSION - Metoprolol on board and BP relatively well controlled  Code Status: DNR Family Communication: discussed with family at bedside Disposition Plan: Pending family and patient wishes.  Per my discussion with daughter she wants to proceed with biopsy but sons and other members of the family may want to go comfort care measures. They will discuss amongst themselves and let me know.   Consultants:  Neurosurgery  Procedures:  None  Antibiotics:  None  HPI/Subjective: Pt has no new complaints. No acute issues reported overnight. Denies any pain.  Objective: Filed Vitals:   09/13/13 0800  BP: 146/50  Pulse: 83  Temp: 98.6 F (37 C)  Resp:     Intake/Output Summary (Last 24 hours) at 09/13/13 1033 Last data filed at 09/13/13 0500  Gross per 24 hour  Intake    310 ml  Output   5250 ml  Net  -4940 ml   Filed Weights   09/11/13 0159 09/12/13 0535 09/13/13 0100  Weight: 63.8 kg (140 lb 10.5 oz) 63.6 kg (140 lb 3.4 oz) 61.78 kg (136 lb 3.2 oz)    Exam:   General:  Pt in nad, alert and awake  Cardiovascular: rrr, no mrg  Respiratory: cta bl, no wheezes  Abdomen: soft, ND, NT  Musculoskeletal: no cyanosis or clubbing   Data Reviewed: Basic Metabolic Panel:  Recent Labs Lab  09/10/13 2347 09/11/13 0350  NA 127* 128*  K 4.1 3.9  CL 90* 92*  CO2 24 23  GLUCOSE 115* 142*  BUN 4* 4*  CREATININE 0.63 0.61  CALCIUM 9.5 8.9  MG  --  1.7  PHOS  --  3.2   Liver Function Tests:  Recent Labs Lab 09/10/13 2347 09/11/13 0350  AST 19 17  ALT 12 12  ALKPHOS 81 78  BILITOT 0.4 0.4  PROT 6.5 6.1  ALBUMIN 3.5 3.2*   No results found for this basename: LIPASE, AMYLASE,  in the last 168 hours No results found for this basename: AMMONIA,  in the last 168 hours CBC:  Recent Labs Lab 09/10/13 2347 09/11/13 0350  WBC 9.5 6.8  NEUTROABS 3.7  --   HGB 12.4 11.8*  HCT 34.7* 33.3*  MCV 86.5 86.7  PLT 296 279   Cardiac Enzymes: No results found for this basename: CKTOTAL, CKMB, CKMBINDEX, TROPONINI,  in the last 168 hours BNP (last 3 results) No results found for this basename: PROBNP,  in the last 8760 hours CBG: No results found for this basename: GLUCAP,  in the last 168 hours  Recent Results (from the past 240 hour(s))  MRSA PCR SCREENING     Status: None   Collection Time    09/11/13  2:00 AM      Result Value Ref Range Status   MRSA by PCR  NEGATIVE  NEGATIVE Final   Comment:            The GeneXpert MRSA Assay (FDA     approved for NASAL specimens     only), is one component of a     comprehensive MRSA colonization     surveillance program. It is not     intended to diagnose MRSA     infection nor to guide or     monitor treatment for     MRSA infections.     Studies: Ct Chest W Contrast  09/12/2013   CLINICAL DATA:  Intracranial metastatic disease.  EXAM: CT CHEST, ABDOMEN, AND PELVIS WITH CONTRAST  TECHNIQUE: Multidetector CT imaging of the chest, abdomen and pelvis was performed following the standard protocol during bolus administration of intravenous contrast.  CONTRAST:  171mL OMNIPAQUE IOHEXOL 300 MG/ML  SOLN  COMPARISON:  MRI scan of September 10, 2013; CT scan of July 15, 2011.  FINDINGS: CT CHEST FINDINGS  No pneumothorax or pleural  effusion is noted. 5 mm nodule is noted posteriorly in the left lung base. Minimal scarring and pleural thickening is noted posteriorly in right lung base. Atherosclerotic calcifications of thoracic aorta are noted without dissection or aneurysm formation. Coronary artery calcifications are noted. 30 x 29 mm lobulated mass is noted in the aortopulmonary window. 10 x 9 mm precarinal lymph node is noted. Pulmonary arteries appear normal. Fat containing abnormality measuring 10.3 x 8.0 x 4.1 cm is seen along the right posterior chest wall most consistent with lipoma.  CT ABDOMEN AND PELVIS FINDINGS  Minimal cholelithiasis is noted. No focal abnormality is noted in the liver, spleen or pancreas. Adrenal glands appear normal. Left kidney appears normal. Moderate right renal atrophy is noted with cortical scarring. No hydronephrosis or renal obstruction is noted. The appendix appears normal. There is no evidence of bowel obstruction. Atherosclerotic calcifications of abdominal aorta and mesenteric arteries are noted without aneurysm formation. No abnormal fluid collection is noted. Urinary bladder appears normal. Uterus and ovaries appear normal. No significant adenopathy is noted in the abdomen or pelvis. Moderate wedge compression deformity of L1 vertebral body is noted with several lucencies within it. Pathologic fracture cannot be excluded.  IMPRESSION: Minimal cholelithiasis.  Moderate right renal atrophy is noted with associated cortical scarring.  Fat containing mass measuring 10.3 x 8.0 x 4.1 cm is seen along right posterior chest wall most consistent with lipoma.  Adenopathy measuring 3.0 x 2.9 cm is noted in the aortopulmonary window of the mediastinum consistent with metastatic disease. Smaller sub cm pretracheal lymph nodes are noted.  Moderate wedge compression deformity of L1 vertebral body is noted which grossly looks like old compression fracture, but lucencies are present within it and pathologic fracture  or metastatic disease cannot be ruled out.  5 mm nodule is noted posteriorly in the left lung base. This may simply represent scarring, but given the other findings described on this exam and on the prior MRI, metastatic disease cannot be excluded.   Electronically Signed   By: Sabino Dick M.D.   On: 09/12/2013 13:51   Ct Abdomen Pelvis W Contrast  09/12/2013   CLINICAL DATA:  Intracranial metastatic disease.  EXAM: CT CHEST, ABDOMEN, AND PELVIS WITH CONTRAST  TECHNIQUE: Multidetector CT imaging of the chest, abdomen and pelvis was performed following the standard protocol during bolus administration of intravenous contrast.  CONTRAST:  165mL OMNIPAQUE IOHEXOL 300 MG/ML  SOLN  COMPARISON:  MRI scan of September 10, 2013; CT  scan of July 15, 2011.  FINDINGS: CT CHEST FINDINGS  No pneumothorax or pleural effusion is noted. 5 mm nodule is noted posteriorly in the left lung base. Minimal scarring and pleural thickening is noted posteriorly in right lung base. Atherosclerotic calcifications of thoracic aorta are noted without dissection or aneurysm formation. Coronary artery calcifications are noted. 30 x 29 mm lobulated mass is noted in the aortopulmonary window. 10 x 9 mm precarinal lymph node is noted. Pulmonary arteries appear normal. Fat containing abnormality measuring 10.3 x 8.0 x 4.1 cm is seen along the right posterior chest wall most consistent with lipoma.  CT ABDOMEN AND PELVIS FINDINGS  Minimal cholelithiasis is noted. No focal abnormality is noted in the liver, spleen or pancreas. Adrenal glands appear normal. Left kidney appears normal. Moderate right renal atrophy is noted with cortical scarring. No hydronephrosis or renal obstruction is noted. The appendix appears normal. There is no evidence of bowel obstruction. Atherosclerotic calcifications of abdominal aorta and mesenteric arteries are noted without aneurysm formation. No abnormal fluid collection is noted. Urinary bladder appears normal. Uterus  and ovaries appear normal. No significant adenopathy is noted in the abdomen or pelvis. Moderate wedge compression deformity of L1 vertebral body is noted with several lucencies within it. Pathologic fracture cannot be excluded.  IMPRESSION: Minimal cholelithiasis.  Moderate right renal atrophy is noted with associated cortical scarring.  Fat containing mass measuring 10.3 x 8.0 x 4.1 cm is seen along right posterior chest wall most consistent with lipoma.  Adenopathy measuring 3.0 x 2.9 cm is noted in the aortopulmonary window of the mediastinum consistent with metastatic disease. Smaller sub cm pretracheal lymph nodes are noted.  Moderate wedge compression deformity of L1 vertebral body is noted which grossly looks like old compression fracture, but lucencies are present within it and pathologic fracture or metastatic disease cannot be ruled out.  5 mm nodule is noted posteriorly in the left lung base. This may simply represent scarring, but given the other findings described on this exam and on the prior MRI, metastatic disease cannot be excluded.   Electronically Signed   By: Sabino Dick M.D.   On: 09/12/2013 13:51    Scheduled Meds: . dexamethasone  10 mg Intravenous 4 times per day  . docusate sodium  100 mg Oral BID  . feeding supplement (ENSURE COMPLETE)  237 mL Oral BID BM  . guaiFENesin  600 mg Oral BID  . metoprolol succinate  25 mg Oral Daily  . nicotine  21 mg Transdermal Daily  . sodium chloride  3 mL Intravenous Q12H   Continuous Infusions:    Time spent: > 35 minutes    Holly Taylor  [facility] Pager [phone]. If 7PM-7AM, please contact night-coverage at [email or web address], password [facility] 09/13/2013, 10:33 AM  LOS: 3 days

## 2013-09-13 NOTE — Progress Notes (Signed)
Pt being discharged home with daughter; IV removed without any complications; pt is all set up with home health and hospice; pt and family denied questions/concerns; all concerns addressed prior to leaving

## 2013-11-20 ENCOUNTER — Other Ambulatory Visit: Payer: Self-pay

## 2013-11-20 MED ORDER — METOPROLOL SUCCINATE ER 25 MG PO TB24: 25.0000 mg | ORAL_TABLET | Freq: Every day | ORAL | Status: AC

## 2013-11-21 DEATH — deceased

## 2016-05-13 IMAGING — CT CT HEAD W/O CM
2 series · 15 of 30 positions shown, 19 images · non-contrast
Comparison: [HOSPITAL] head CT 08/07/2011, and earlier.

CLINICAL DATA: 85-year-old female with orthostatics hypotension.
Memory loss and confusion. Initial encounter. No known malignancy.

EXAM:
CT HEAD WITHOUT CONTRAST
TECHNIQUE: Contiguous axial images were obtained from the base of the skull
through the vertex without intravenous contrast.

[Series 3: head bone · axial · 0.49mm/px · z∈[+14,+35]mm · 2 of 28 slices shown]
[im 2/28  bone]
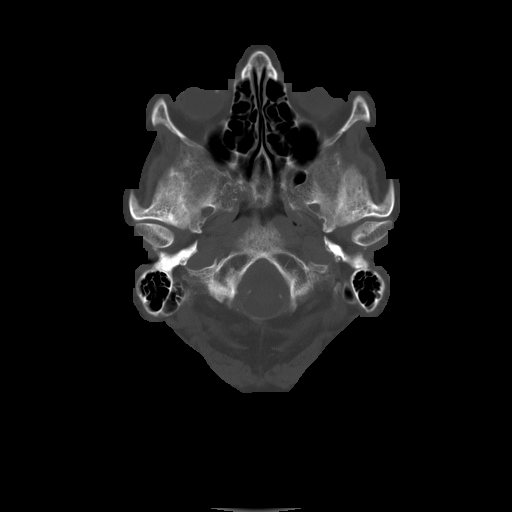
[im 6/28  bone]
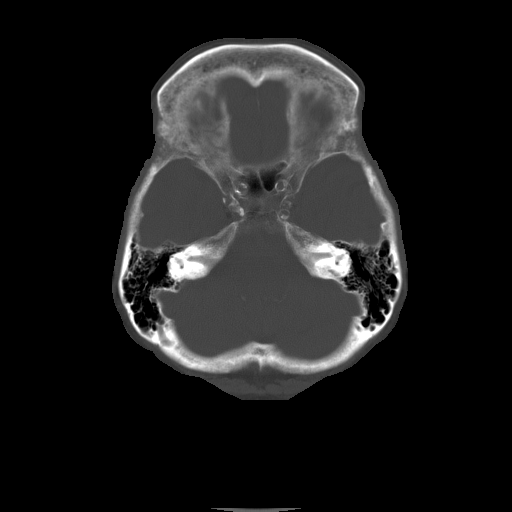

[Series 32: 3d filtered head w/o · axial · non-contrast · 0.49mm/px · z∈[+14,+141]mm · 13 of 28 slices shown, 17 images]
[im 2/28  brain]
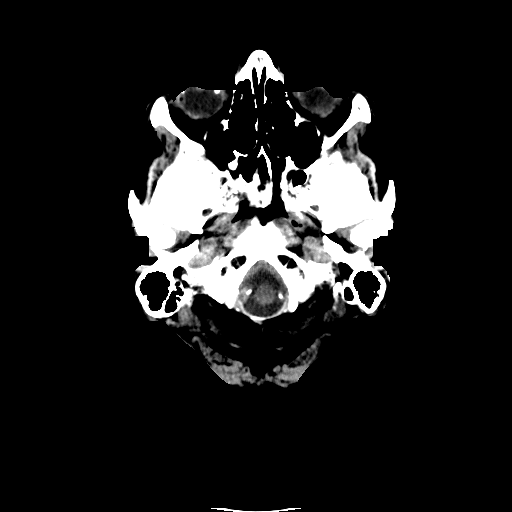
[im 2/28  bone]
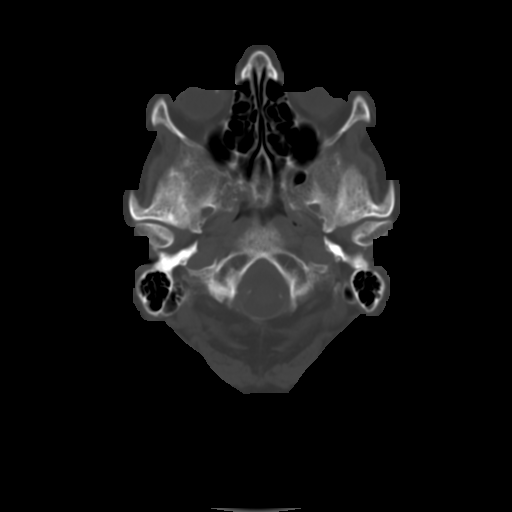
[im 4/28  brain]
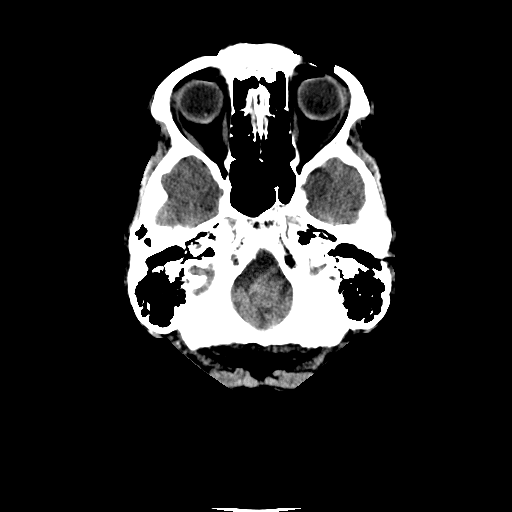
[im 6/28  brain]
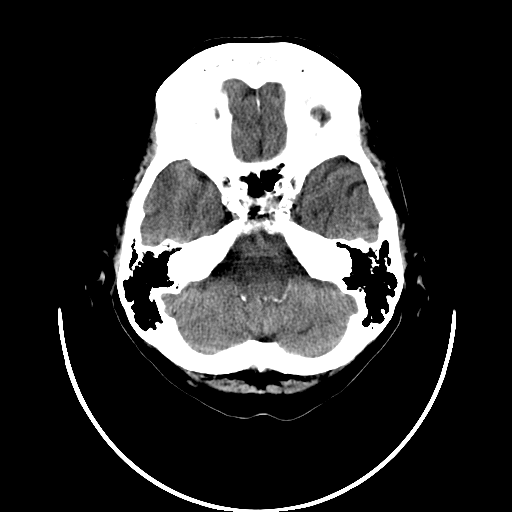
[im 8/28  brain]
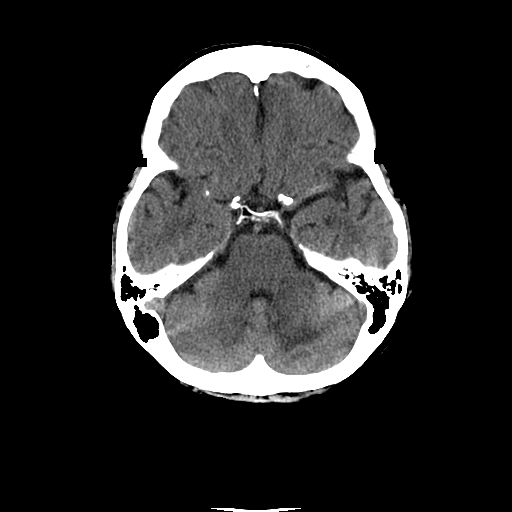
[im 10/28  brain]
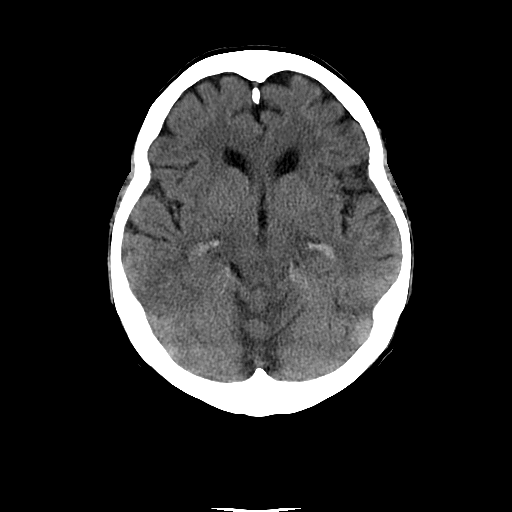
[im 10/28  bone]
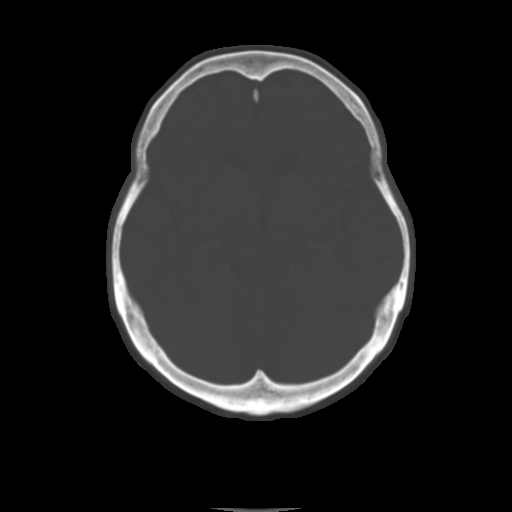
[im 12/28  brain]
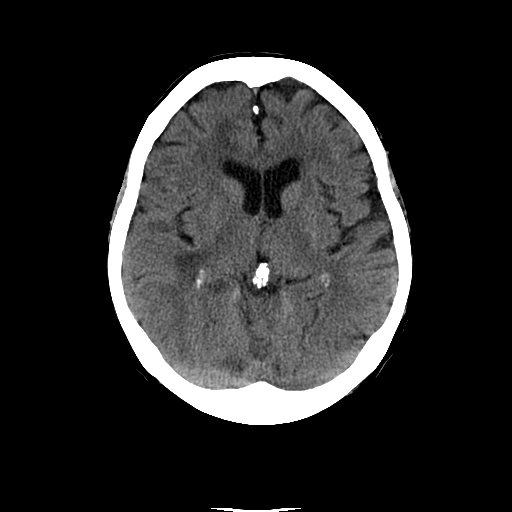
[im 14/28  brain]
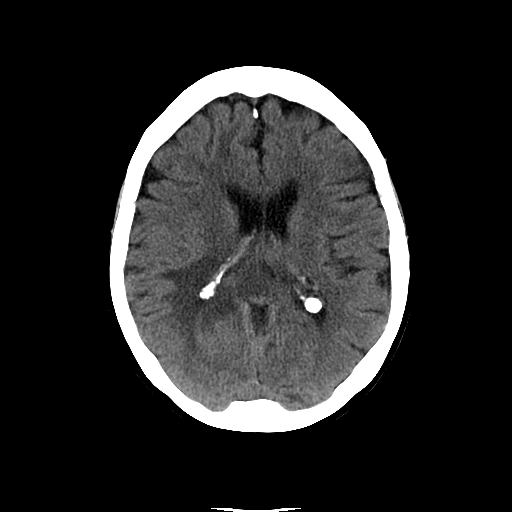
[im 16/28  brain]
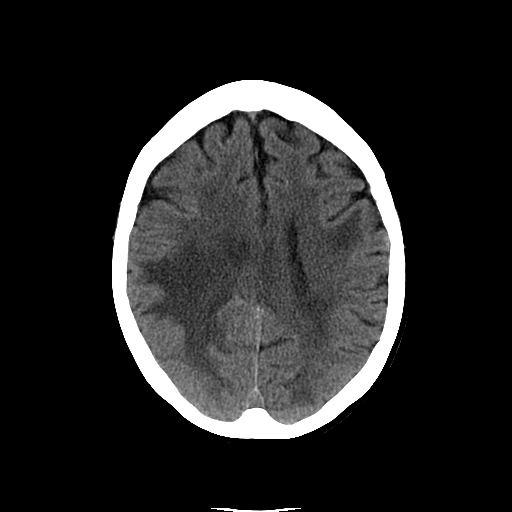
[im 18/28  brain]
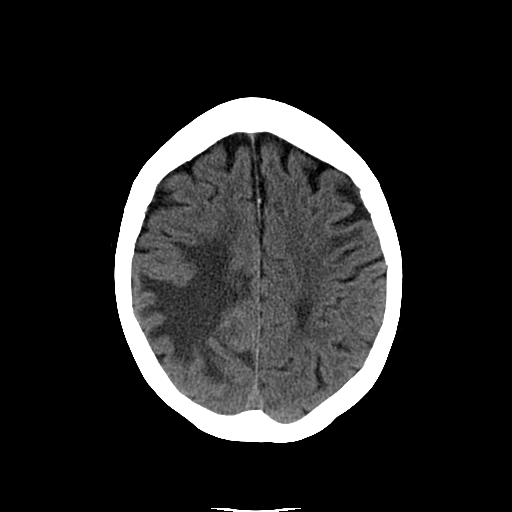
[im 18/28  bone]
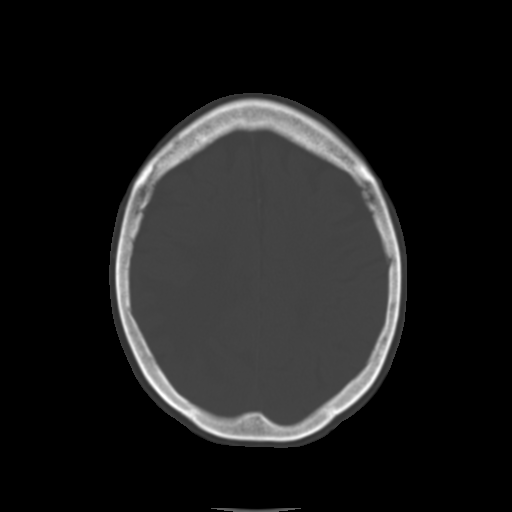
[im 20/28  brain]
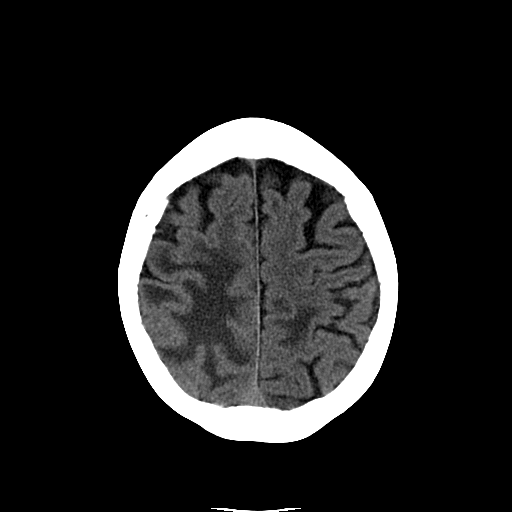
[im 22/28  brain]
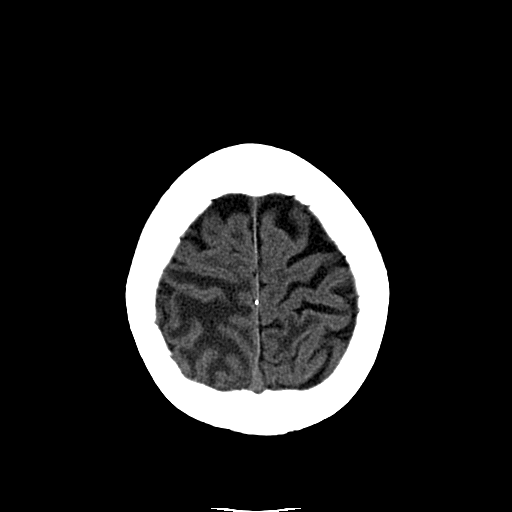
[im 24/28  brain]
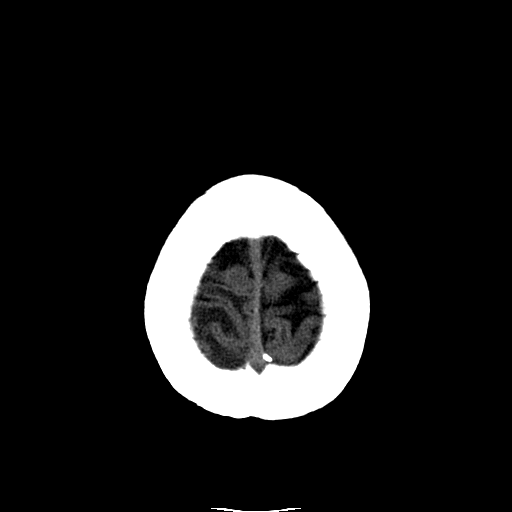
[im 26/28  brain]
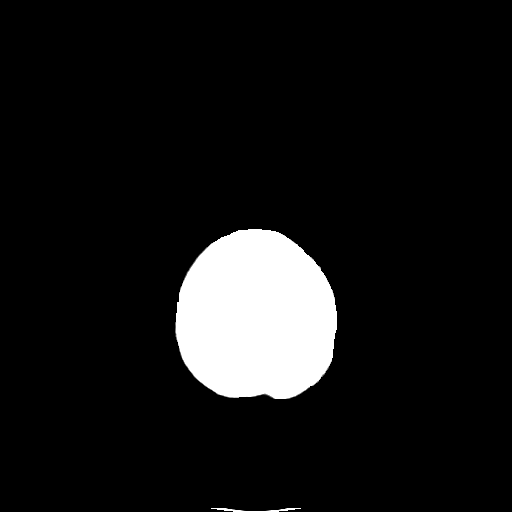
[im 26/28  bone]
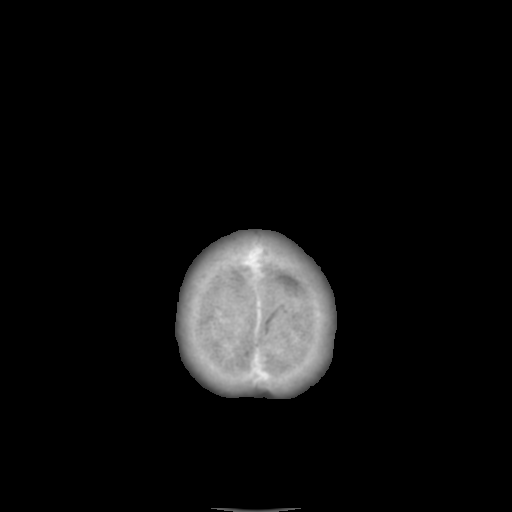

[15 of 30 positions shown; findings below may reference images not displayed]

FINDINGS: Visualized paranasal sinuses and mastoids are clear. Calcified
atherosclerosis at the skull base. Postoperative changes to the
globes. Negative scalp soft tissues. No acute or suspicious osseous
lesion identified.

New abnormal hypodensity, widespread in the right superior frontal
lobe is in a vasogenic edema pattern and appears related to a 25 mm
soft tissue mass located within the medial right parietal lobe
versus along the right interhemispheric fissure. There is also
abnormal vasogenic edema pattern hypodensity in the left superior
frontal gyrus. There is a small focus of edema in the left frontal
operculum (image 17). There is possibly also a small focus of edema
in the left occipital pole (image 16). There is also abnormal
hypodensity which is more amorphous in the central superior
cerebellum, tracking to the left. Subtle evidence of a left
cerebellar mass which may measure up to 2 cm on image 8.

Despite these findings, no impending herniation or loss of the
basilar cisterns. There is mild leftward midline shift of 4-5 mm. No
ventriculomegaly. No acute intracranial hemorrhage identified. No
superimposed acute cortically based infarct identified. No
suspicious intracranial vascular hyperdensity.
IMPRESSION: 1. New multifocal cerebral edema suggesting multiple Brain masses,
at least 5 lesions suspected on the basis of this study. Favor
metastatic disease to the brain. Followup brain MRI (without and
with contrast) or alternatively post-contrast head CT would
characterize further.
2. No impending herniation. No ventriculomegaly. There is mild
leftward midline shift.
Critical Value/emergent results were called by telephone at the time
of interpretation on 09/09/2013 at [DATE] to Dr. SILVINO MERTENS ,
who verbally acknowledged these results.

## 2016-05-13 IMAGING — US US CAROTID DUPLEX BILAT
1 series · 13 of 24 positions shown · non-contrast
Comparison: Head CT -08/07/2011

CLINICAL DATA: CAD, syncopal episode, possible TIA. History of
hyperlipidemia and smoking. Carotid bruit. Possible history of
vascular stents within the bilateral lower extremity arteries.

EXAM:
BILATERAL CAROTID DUPLEX ULTRASOUND
TECHNIQUE: Gray scale imaging, color Doppler and duplex ultrasound were
performed of bilateral carotid and vertebral arteries in the neck.

[Series 1: us carotid duplex bilat · 0.09mm/px · 13 of 72 slices shown]
[im 1/72]
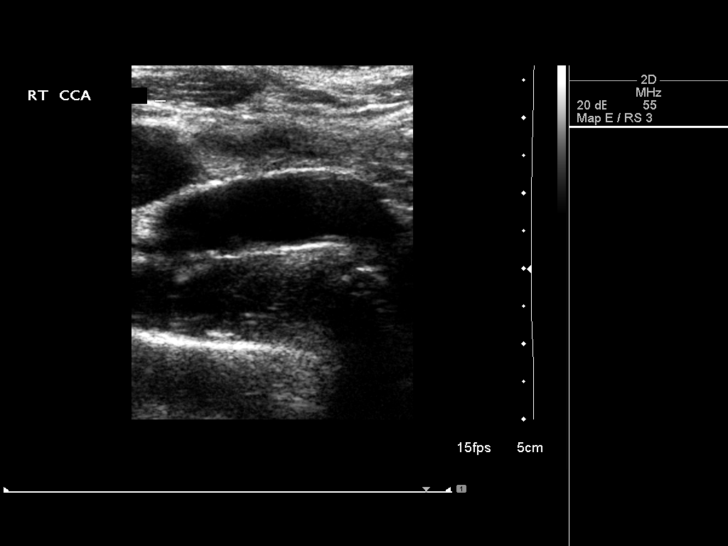
[im 7/72]
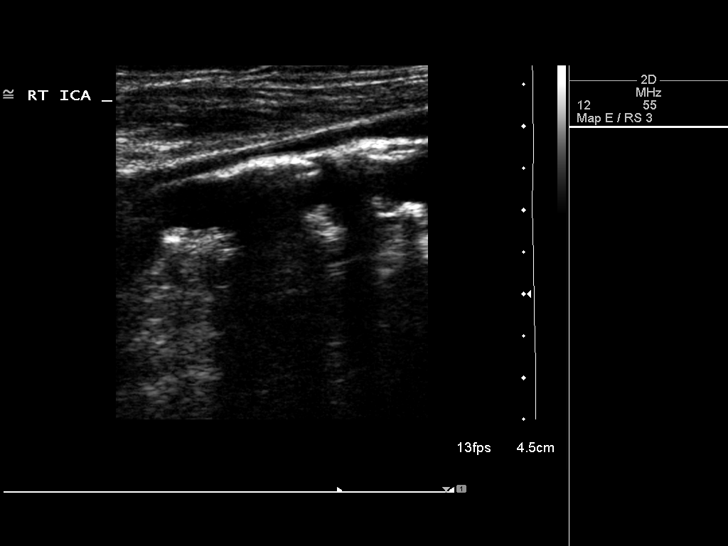
[im 13/72]
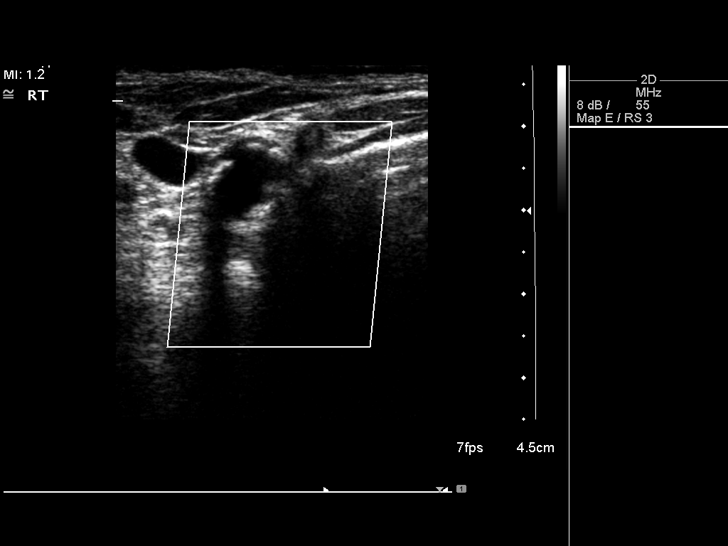
[im 19/72]
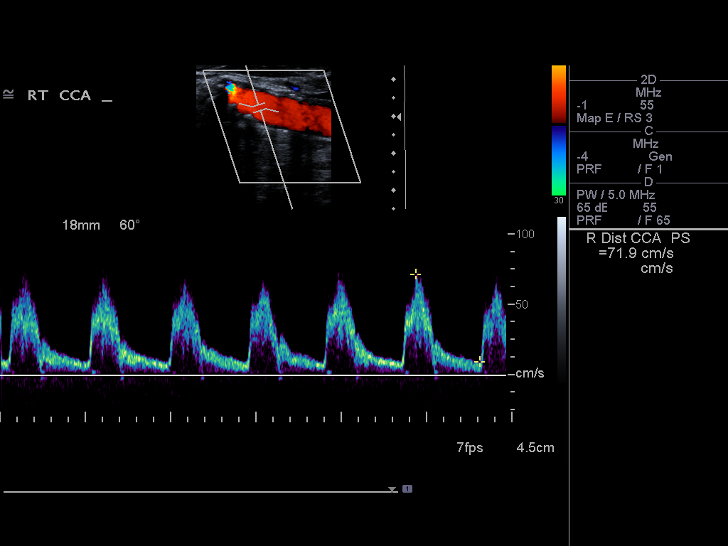
[im 25/72]
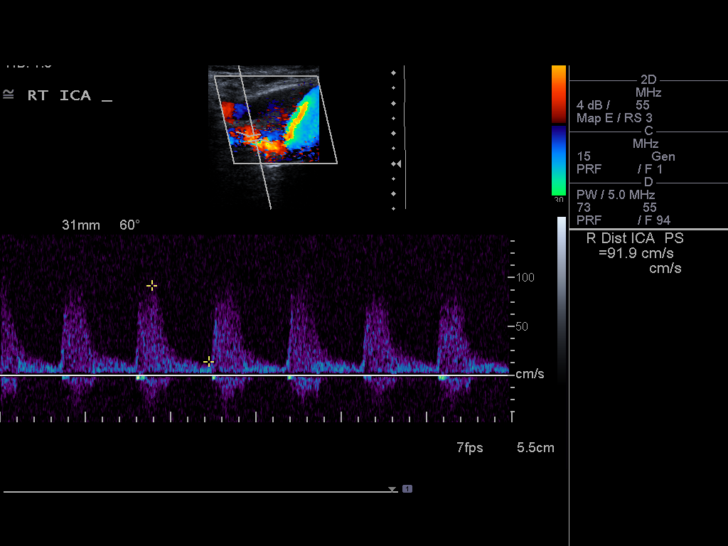
[im 31/72]
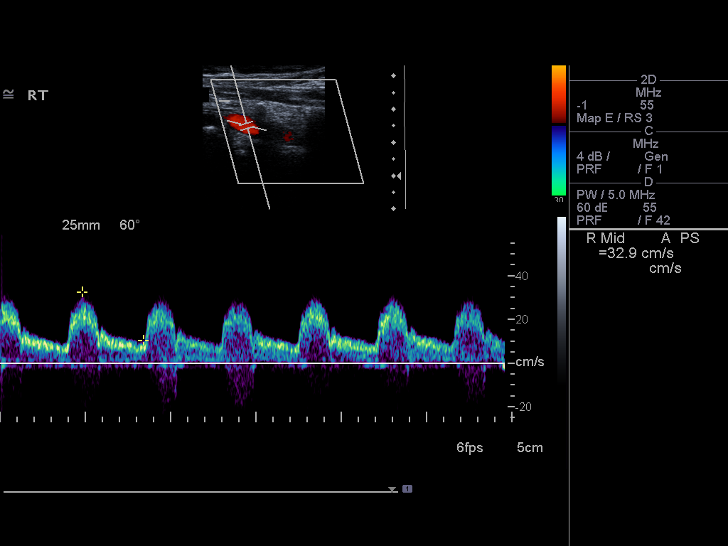
[im 38/72]
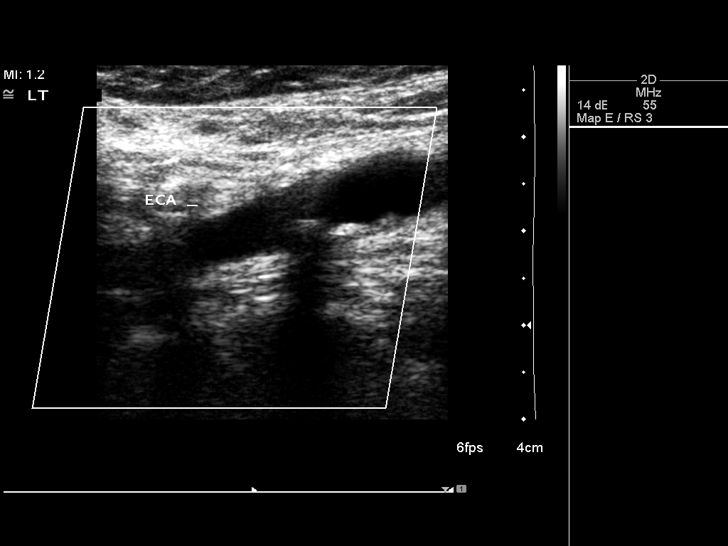
[im 41/72]
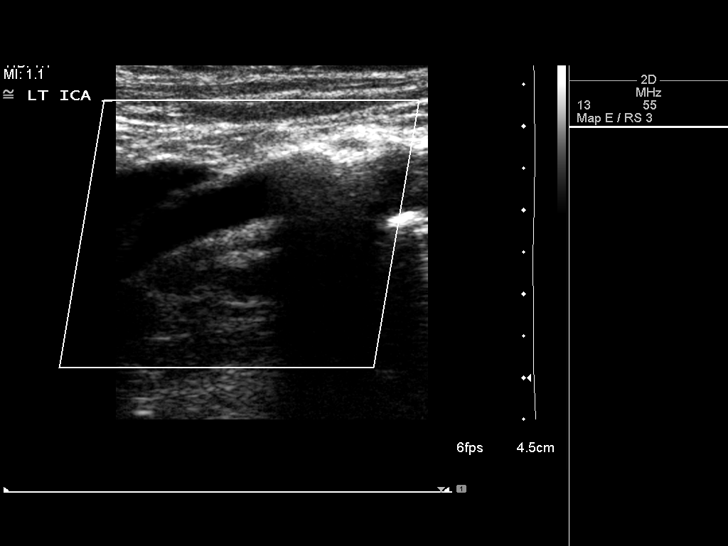
[im 47/72]
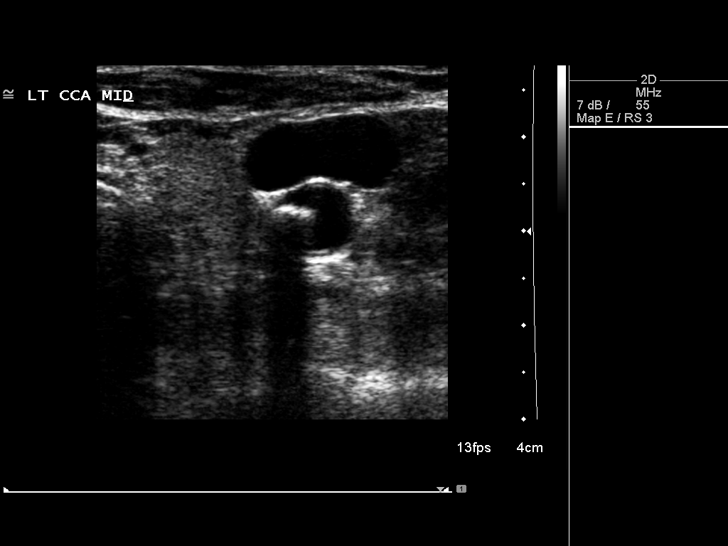
[im 53/72]
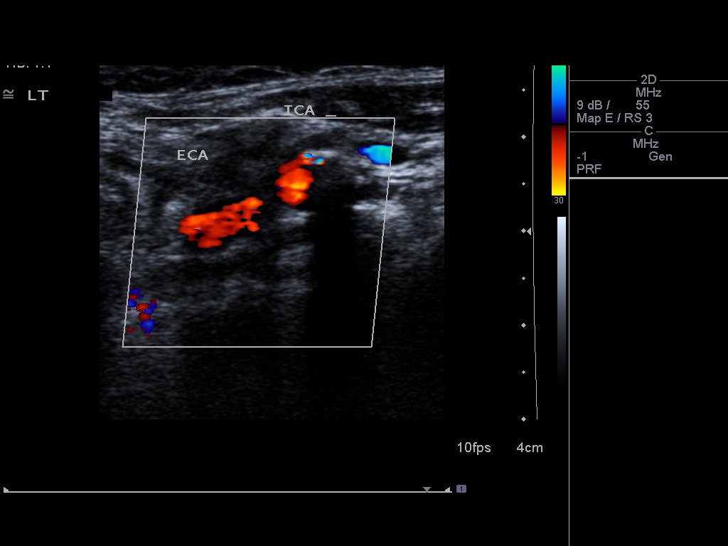
[im 59/72]
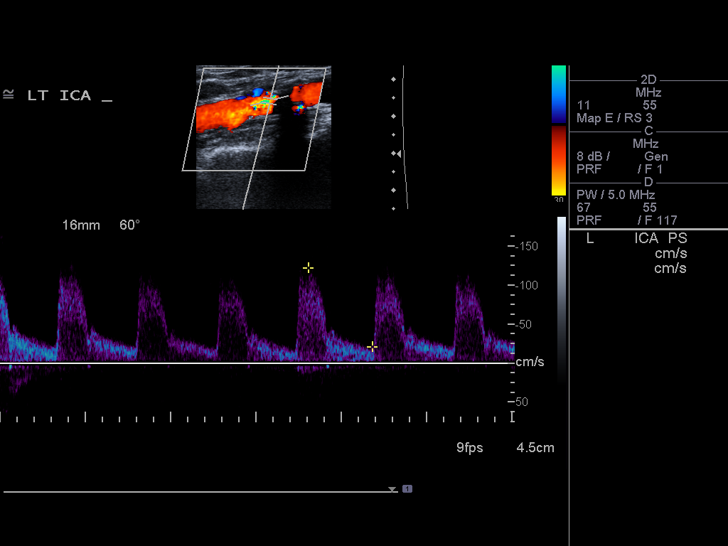
[im 65/72]
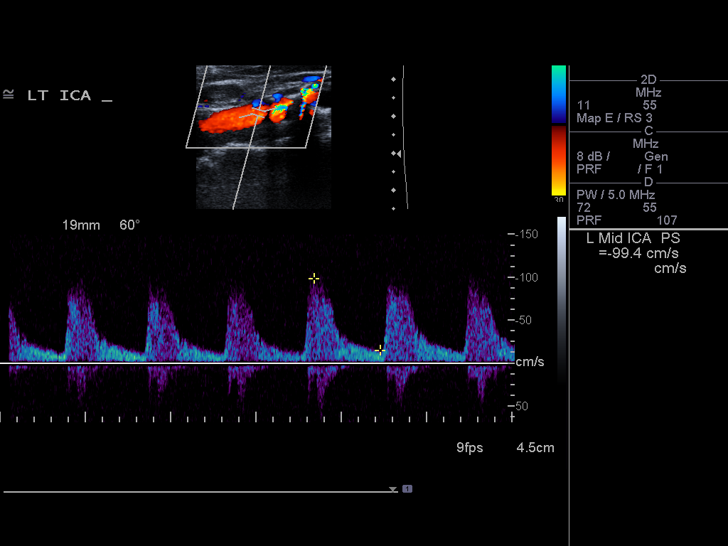
[im 72/72]
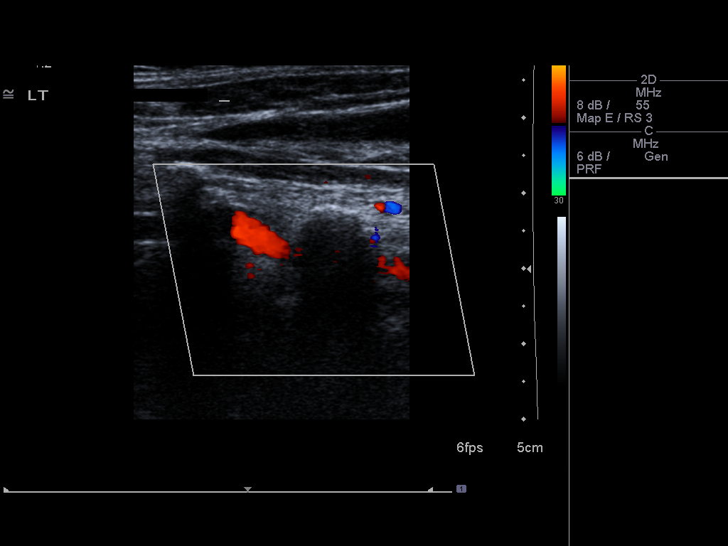

[13 of 24 positions shown; findings below may reference images not displayed]

FINDINGS: Criteria: Quantification of carotid stenosis is based on velocity
parameters that correlate the residual internal carotid diameter
with NASCET-based stenosis levels, using the diameter of the distal
internal carotid lumen as the denominator for stenosis measurement.

The following velocity measurements were obtained:

RIGHT

ICA:  95/16 cm/sec

CCA:  98/8 cm/sec

SYSTOLIC ICA/CCA RATIO:

DIASTOLIC ICA/CCA RATIO:

ECA:  246 cm/sec

LEFT

ICA:  155/22 cm/sec

CCA:  137/8 cm/sec

SYSTOLIC ICA/CCA RATIO:

DIASTOLIC ICA/CCA RATIO:

ECA:  97 cm/sec

RIGHT CAROTID ARTERY: There is a moderate amount of scattered E
centric echogenic foci of partially shadowing atherosclerotic plaque
throughout the right common carotid artery (representative images or
and 5). There is a moderate amount of eccentric mixed echogenic
partially shadowing plaque within the right carotid bulb (images 6
and 7, extending to involve the origin and proximal aspect of the
right internal carotid artery (image 8), not resulting in elevated
peak systolic velocities in the interrogated course of the right
internal carotid artery to suggest a hemodynamically significant
stenosis.

RIGHT VERTEBRAL ARTERY:  Antegrade flow

LEFT CAROTID ARTERY: There is a moderate amount of eccentric
echogenic partially shadowing plaque within the mid aspect of the
left common carotid artery (images 37 and 49). There is a moderate
to large amount of eccentric mixed echogenic partially shadowing
plaque within the left carotid bulb (images 39 and 52), extending to
involve the origin and proximal aspects of the left internal carotid
artery (images 41, 42 and 43), resulting in elevated peak systolic
velocities within the proximal aspect of the left internal carotid
artery (greatest peak systolic velocity measuring 155 cm/sec - image
66).

LEFT VERTEBRAL ARTERY:  Antegrade flow
IMPRESSION: 1. Moderate to large amount of left-sided atherosclerotic plaque
results in elevated peak systolic velocities within the left
internal carotid artery compatible with the 50-69% luminal narrowing
range. Further evaluation with CTA could be performed as clinically
indicated.
2. Moderate amount of right-sided atherosclerotic plaque, not
definitely resulting in a hemodynamically significant stenosis.
